# Patient Record
Sex: Male | Born: 1945 | Race: Black or African American | Hispanic: No | Smoking: Former smoker
Health system: Southern US, Community
[De-identification: ages and names within clinical notes are randomized; demographics above are authoritative.]

## PROBLEM LIST (undated history)

## (undated) DIAGNOSIS — C801 Malignant (primary) neoplasm, unspecified: Secondary | ICD-10-CM

## (undated) DIAGNOSIS — A059 Bacterial foodborne intoxication, unspecified: Secondary | ICD-10-CM

## (undated) DIAGNOSIS — Z87442 Personal history of urinary calculi: Secondary | ICD-10-CM

## (undated) HISTORY — PX: APPENDECTOMY: SHX54

## (undated) HISTORY — DX: Bacterial foodborne intoxication, unspecified: A05.9

---

## 2001-10-12 ENCOUNTER — Encounter: Payer: Self-pay | Admitting: Internal Medicine

## 2001-10-12 ENCOUNTER — Ambulatory Visit (HOSPITAL_COMMUNITY): Admission: RE | Admit: 2001-10-12 | Discharge: 2001-10-12 | Payer: Self-pay | Admitting: Internal Medicine

## 2002-07-28 ENCOUNTER — Emergency Department (HOSPITAL_COMMUNITY): Admission: EM | Admit: 2002-07-28 | Discharge: 2002-07-28 | Payer: Self-pay | Admitting: Emergency Medicine

## 2003-01-12 ENCOUNTER — Ambulatory Visit (HOSPITAL_COMMUNITY): Admission: RE | Admit: 2003-01-12 | Discharge: 2003-01-12 | Payer: Self-pay | Admitting: General Surgery

## 2003-04-01 ENCOUNTER — Ambulatory Visit (HOSPITAL_COMMUNITY): Admission: RE | Admit: 2003-04-01 | Discharge: 2003-04-01 | Payer: Self-pay | Admitting: Family Medicine

## 2003-04-01 ENCOUNTER — Encounter: Payer: Self-pay | Admitting: Family Medicine

## 2003-04-09 ENCOUNTER — Emergency Department (HOSPITAL_COMMUNITY): Admission: EM | Admit: 2003-04-09 | Discharge: 2003-04-09 | Payer: Self-pay | Admitting: *Deleted

## 2003-04-09 ENCOUNTER — Encounter: Payer: Self-pay | Admitting: *Deleted

## 2003-05-13 ENCOUNTER — Encounter: Payer: Self-pay | Admitting: Emergency Medicine

## 2003-05-13 ENCOUNTER — Emergency Department (HOSPITAL_COMMUNITY): Admission: EM | Admit: 2003-05-13 | Discharge: 2003-05-13 | Payer: Self-pay | Admitting: Unknown Physician Specialty

## 2003-05-13 ENCOUNTER — Emergency Department (HOSPITAL_COMMUNITY): Admission: EM | Admit: 2003-05-13 | Discharge: 2003-05-13 | Payer: Self-pay | Admitting: Emergency Medicine

## 2004-02-29 ENCOUNTER — Encounter (INDEPENDENT_AMBULATORY_CARE_PROVIDER_SITE_OTHER): Payer: Self-pay | Admitting: Family Medicine

## 2004-02-29 ENCOUNTER — Ambulatory Visit (HOSPITAL_COMMUNITY): Admission: RE | Admit: 2004-02-29 | Discharge: 2004-02-29 | Payer: Self-pay | Admitting: Family Medicine

## 2004-05-21 ENCOUNTER — Encounter (INDEPENDENT_AMBULATORY_CARE_PROVIDER_SITE_OTHER): Payer: Self-pay | Admitting: Family Medicine

## 2004-05-21 LAB — CONVERTED CEMR LAB: PSA: 0.39 ng/mL

## 2005-07-09 ENCOUNTER — Ambulatory Visit (HOSPITAL_COMMUNITY): Admission: RE | Admit: 2005-07-09 | Discharge: 2005-07-09 | Payer: Self-pay | Admitting: Family Medicine

## 2005-08-12 ENCOUNTER — Ambulatory Visit (HOSPITAL_COMMUNITY): Admission: RE | Admit: 2005-08-12 | Discharge: 2005-08-12 | Payer: Self-pay | Admitting: Family Medicine

## 2006-04-02 ENCOUNTER — Ambulatory Visit (HOSPITAL_COMMUNITY): Admission: RE | Admit: 2006-04-02 | Discharge: 2006-04-02 | Payer: Self-pay | Admitting: Family Medicine

## 2006-07-17 ENCOUNTER — Ambulatory Visit: Payer: Self-pay | Admitting: Family Medicine

## 2006-07-18 ENCOUNTER — Encounter (INDEPENDENT_AMBULATORY_CARE_PROVIDER_SITE_OTHER): Payer: Self-pay | Admitting: Family Medicine

## 2006-07-18 LAB — CONVERTED CEMR LAB
RBC count: 5.25 10*6/uL
WBC, blood: 5.9 10*3/uL

## 2006-07-31 ENCOUNTER — Ambulatory Visit: Payer: Self-pay | Admitting: Family Medicine

## 2006-11-24 ENCOUNTER — Ambulatory Visit: Payer: Self-pay | Admitting: Family Medicine

## 2006-11-29 ENCOUNTER — Encounter: Payer: Self-pay | Admitting: Family Medicine

## 2006-11-29 DIAGNOSIS — M545 Low back pain, unspecified: Secondary | ICD-10-CM | POA: Insufficient documentation

## 2006-11-29 DIAGNOSIS — J4489 Other specified chronic obstructive pulmonary disease: Secondary | ICD-10-CM | POA: Insufficient documentation

## 2006-11-29 DIAGNOSIS — E785 Hyperlipidemia, unspecified: Secondary | ICD-10-CM | POA: Insufficient documentation

## 2006-11-29 DIAGNOSIS — M129 Arthropathy, unspecified: Secondary | ICD-10-CM | POA: Insufficient documentation

## 2006-11-29 DIAGNOSIS — J449 Chronic obstructive pulmonary disease, unspecified: Secondary | ICD-10-CM | POA: Insufficient documentation

## 2007-01-05 ENCOUNTER — Ambulatory Visit: Payer: Self-pay | Admitting: Family Medicine

## 2007-01-05 DIAGNOSIS — F528 Other sexual dysfunction not due to a substance or known physiological condition: Secondary | ICD-10-CM | POA: Insufficient documentation

## 2007-01-05 LAB — CONVERTED CEMR LAB: LDL Goal: 160 mg/dL

## 2007-07-14 ENCOUNTER — Telehealth (INDEPENDENT_AMBULATORY_CARE_PROVIDER_SITE_OTHER): Payer: Self-pay | Admitting: Family Medicine

## 2007-07-14 ENCOUNTER — Ambulatory Visit: Payer: Self-pay | Admitting: Family Medicine

## 2007-08-11 ENCOUNTER — Ambulatory Visit: Payer: Self-pay | Admitting: Family Medicine

## 2007-08-11 LAB — CONVERTED CEMR LAB: OCCULT 1: NEGATIVE

## 2007-08-12 ENCOUNTER — Encounter (INDEPENDENT_AMBULATORY_CARE_PROVIDER_SITE_OTHER): Payer: Self-pay | Admitting: Family Medicine

## 2007-08-12 ENCOUNTER — Telehealth (INDEPENDENT_AMBULATORY_CARE_PROVIDER_SITE_OTHER): Payer: Self-pay | Admitting: *Deleted

## 2007-08-12 LAB — CONVERTED CEMR LAB
ALT: <8 units/L (ref 0–53)
BUN: 15 mg/dL (ref 6–23)
Basophils Relative: 1 % (ref 0–1)
Eosinophils Absolute: 0.1 10*3/uL (ref 0.0–0.7)
Eosinophils Relative: 3 % (ref 0–5)
LDL Cholesterol: 133 mg/dL — ABNORMAL HIGH (ref 0–99)
Lymphocytes Relative: 42 % (ref 12–46)
Lymphs Abs: 1.6 10*3/uL (ref 0.7–3.3)
MCHC: 32.9 g/dL (ref 30.0–36.0)
MCV: 90.9 fL (ref 78.0–100.0)
Monocytes Absolute: 0.6 10*3/uL (ref 0.2–0.7)
PSA: 0.34 ng/mL (ref 0.10–4.00)
Platelets: 242 10*3/uL (ref 150–400)
Potassium: 4.2 meq/L (ref 3.5–5.3)
Sodium: 141 meq/L (ref 135–145)
TSH: 1.094 microintl units/mL (ref 0.350–5.50)
Total Bilirubin: 0.5 mg/dL (ref 0.3–1.2)
Total Protein: 6.6 g/dL (ref 6.0–8.3)
VLDL: 25 mg/dL (ref 0–40)
WBC: 3.7 10*3/uL — ABNORMAL LOW (ref 4.0–10.5)

## 2007-09-22 ENCOUNTER — Ambulatory Visit: Payer: Self-pay | Admitting: Family Medicine

## 2007-09-22 LAB — CONVERTED CEMR LAB: LDL Goal: 130 mg/dL

## 2007-12-22 ENCOUNTER — Ambulatory Visit: Payer: Self-pay | Admitting: Family Medicine

## 2007-12-23 ENCOUNTER — Telehealth (INDEPENDENT_AMBULATORY_CARE_PROVIDER_SITE_OTHER): Payer: Self-pay | Admitting: *Deleted

## 2008-02-05 ENCOUNTER — Ambulatory Visit: Payer: Self-pay | Admitting: Family Medicine

## 2008-02-05 ENCOUNTER — Ambulatory Visit (HOSPITAL_COMMUNITY): Admission: RE | Admit: 2008-02-05 | Discharge: 2008-02-05 | Payer: Self-pay | Admitting: Family Medicine

## 2008-02-08 ENCOUNTER — Telehealth (INDEPENDENT_AMBULATORY_CARE_PROVIDER_SITE_OTHER): Payer: Self-pay | Admitting: Family Medicine

## 2008-02-12 ENCOUNTER — Telehealth (INDEPENDENT_AMBULATORY_CARE_PROVIDER_SITE_OTHER): Payer: Self-pay | Admitting: *Deleted

## 2008-02-12 ENCOUNTER — Ambulatory Visit (HOSPITAL_COMMUNITY): Admission: RE | Admit: 2008-02-12 | Discharge: 2008-02-12 | Payer: Self-pay | Admitting: Family Medicine

## 2008-02-12 ENCOUNTER — Ambulatory Visit: Payer: Self-pay | Admitting: Family Medicine

## 2008-02-12 DIAGNOSIS — F411 Generalized anxiety disorder: Secondary | ICD-10-CM | POA: Insufficient documentation

## 2008-02-12 DIAGNOSIS — E46 Unspecified protein-calorie malnutrition: Secondary | ICD-10-CM | POA: Insufficient documentation

## 2008-03-29 ENCOUNTER — Ambulatory Visit: Payer: Self-pay | Admitting: Family Medicine

## 2008-03-31 ENCOUNTER — Telehealth (INDEPENDENT_AMBULATORY_CARE_PROVIDER_SITE_OTHER): Payer: Self-pay | Admitting: *Deleted

## 2008-04-01 ENCOUNTER — Telehealth (INDEPENDENT_AMBULATORY_CARE_PROVIDER_SITE_OTHER): Payer: Self-pay | Admitting: *Deleted

## 2008-04-12 ENCOUNTER — Ambulatory Visit: Payer: Self-pay | Admitting: Family Medicine

## 2008-04-13 ENCOUNTER — Encounter (INDEPENDENT_AMBULATORY_CARE_PROVIDER_SITE_OTHER): Payer: Self-pay | Admitting: Family Medicine

## 2008-04-26 ENCOUNTER — Ambulatory Visit: Payer: Self-pay | Admitting: Family Medicine

## 2008-05-10 ENCOUNTER — Ambulatory Visit: Payer: Self-pay | Admitting: Family Medicine

## 2008-09-09 ENCOUNTER — Encounter (INDEPENDENT_AMBULATORY_CARE_PROVIDER_SITE_OTHER): Payer: Self-pay | Admitting: Family Medicine

## 2008-09-09 LAB — CONVERTED CEMR LAB
ALT: 9 units/L (ref 0–53)
AST: 18 units/L (ref 0–37)
Albumin: 4.1 g/dL (ref 3.5–5.2)
BUN: 14 mg/dL (ref 6–23)
CO2: 22 meq/L (ref 19–32)
Chloride: 109 meq/L (ref 96–112)
Cholesterol: 190 mg/dL (ref 0–200)
Creatinine, Ser: 1.08 mg/dL (ref 0.40–1.50)
Potassium: 4.6 meq/L (ref 3.5–5.3)
Sodium: 142 meq/L (ref 135–145)
Total Bilirubin: 0.6 mg/dL (ref 0.3–1.2)

## 2008-09-12 ENCOUNTER — Ambulatory Visit: Payer: Self-pay | Admitting: Family Medicine

## 2008-09-20 ENCOUNTER — Ambulatory Visit: Payer: Self-pay | Admitting: Family Medicine

## 2008-09-20 LAB — CONVERTED CEMR LAB: Inflenza A Ag: NEGATIVE

## 2008-10-17 ENCOUNTER — Ambulatory Visit: Payer: Self-pay | Admitting: Family Medicine

## 2008-10-18 LAB — CONVERTED CEMR LAB
Amylase: 71 units/L (ref 0–105)
BUN: 14 mg/dL (ref 6–23)
Basophils Absolute: 0 10*3/uL (ref 0.0–0.1)
Chloride: 106 meq/L (ref 96–112)
Creatinine, Ser: 0.86 mg/dL (ref 0.40–1.50)
Eosinophils Relative: 4 % (ref 0–5)
Lipase: 28 units/L (ref 0–75)
Lymphs Abs: 1.6 10*3/uL (ref 0.7–4.0)
MCV: 89.9 fL (ref 78.0–100.0)
Monocytes Relative: 12 % (ref 3–12)
Neutrophils Relative %: 52 % (ref 43–77)
Platelets: 284 10*3/uL (ref 150–400)
Potassium: 4.5 meq/L (ref 3.5–5.3)
RDW: 14 % (ref 11.5–15.5)
Sodium: 141 meq/L (ref 135–145)
Total Bilirubin: 0.5 mg/dL (ref 0.3–1.2)
Total Protein: 6.8 g/dL (ref 6.0–8.3)
WBC: 5.1 10*3/uL (ref 4.0–10.5)

## 2008-10-24 ENCOUNTER — Ambulatory Visit: Payer: Self-pay | Admitting: Family Medicine

## 2008-10-24 LAB — CONVERTED CEMR LAB
Bilirubin Urine: NEGATIVE
Blood in Urine, dipstick: NEGATIVE
Glucose, Urine, Semiquant: NEGATIVE
Protein, U semiquant: NEGATIVE
pH: 7

## 2008-10-25 ENCOUNTER — Encounter (INDEPENDENT_AMBULATORY_CARE_PROVIDER_SITE_OTHER): Payer: Self-pay | Admitting: Family Medicine

## 2008-10-25 ENCOUNTER — Ambulatory Visit (HOSPITAL_COMMUNITY): Admission: RE | Admit: 2008-10-25 | Discharge: 2008-10-25 | Payer: Self-pay | Admitting: Family Medicine

## 2008-10-26 ENCOUNTER — Encounter (INDEPENDENT_AMBULATORY_CARE_PROVIDER_SITE_OTHER): Payer: Self-pay | Admitting: Family Medicine

## 2008-10-26 ENCOUNTER — Ambulatory Visit (HOSPITAL_COMMUNITY): Admission: RE | Admit: 2008-10-26 | Discharge: 2008-10-26 | Payer: Self-pay | Admitting: Family Medicine

## 2008-10-29 ENCOUNTER — Encounter (INDEPENDENT_AMBULATORY_CARE_PROVIDER_SITE_OTHER): Payer: Self-pay | Admitting: Family Medicine

## 2008-10-31 ENCOUNTER — Ambulatory Visit: Payer: Self-pay | Admitting: Internal Medicine

## 2008-10-31 ENCOUNTER — Ambulatory Visit: Payer: Self-pay | Admitting: Family Medicine

## 2008-11-07 ENCOUNTER — Encounter (HOSPITAL_COMMUNITY): Admission: RE | Admit: 2008-11-07 | Discharge: 2008-11-23 | Payer: Self-pay | Admitting: Oncology

## 2008-11-15 ENCOUNTER — Encounter (INDEPENDENT_AMBULATORY_CARE_PROVIDER_SITE_OTHER): Payer: Self-pay | Admitting: Family Medicine

## 2008-11-25 DIAGNOSIS — C801 Malignant (primary) neoplasm, unspecified: Secondary | ICD-10-CM

## 2008-11-25 HISTORY — PX: SMALL INTESTINE SURGERY: SHX150

## 2008-11-25 HISTORY — DX: Malignant (primary) neoplasm, unspecified: C80.1

## 2008-11-28 ENCOUNTER — Encounter (INDEPENDENT_AMBULATORY_CARE_PROVIDER_SITE_OTHER): Payer: Self-pay | Admitting: Family Medicine

## 2008-12-12 ENCOUNTER — Ambulatory Visit: Payer: Self-pay | Admitting: Family Medicine

## 2008-12-12 DIAGNOSIS — M25519 Pain in unspecified shoulder: Secondary | ICD-10-CM | POA: Insufficient documentation

## 2008-12-13 ENCOUNTER — Encounter (INDEPENDENT_AMBULATORY_CARE_PROVIDER_SITE_OTHER): Payer: Self-pay | Admitting: Family Medicine

## 2008-12-23 ENCOUNTER — Encounter (INDEPENDENT_AMBULATORY_CARE_PROVIDER_SITE_OTHER): Payer: Self-pay | Admitting: Family Medicine

## 2008-12-30 ENCOUNTER — Encounter (INDEPENDENT_AMBULATORY_CARE_PROVIDER_SITE_OTHER): Payer: Self-pay | Admitting: Family Medicine

## 2009-01-16 ENCOUNTER — Ambulatory Visit: Payer: Self-pay | Admitting: Family Medicine

## 2009-01-16 DIAGNOSIS — R197 Diarrhea, unspecified: Secondary | ICD-10-CM | POA: Insufficient documentation

## 2009-01-16 DIAGNOSIS — J61 Pneumoconiosis due to asbestos and other mineral fibers: Secondary | ICD-10-CM | POA: Insufficient documentation

## 2009-01-16 DIAGNOSIS — R634 Abnormal weight loss: Secondary | ICD-10-CM | POA: Insufficient documentation

## 2009-02-10 ENCOUNTER — Encounter (INDEPENDENT_AMBULATORY_CARE_PROVIDER_SITE_OTHER): Payer: Self-pay | Admitting: Family Medicine

## 2009-02-27 ENCOUNTER — Ambulatory Visit: Payer: Self-pay | Admitting: Family Medicine

## 2009-02-27 ENCOUNTER — Ambulatory Visit (HOSPITAL_COMMUNITY): Admission: RE | Admit: 2009-02-27 | Discharge: 2009-02-27 | Payer: Self-pay | Admitting: Family Medicine

## 2009-02-27 DIAGNOSIS — M25559 Pain in unspecified hip: Secondary | ICD-10-CM | POA: Insufficient documentation

## 2009-02-28 ENCOUNTER — Encounter (INDEPENDENT_AMBULATORY_CARE_PROVIDER_SITE_OTHER): Payer: Self-pay | Admitting: Family Medicine

## 2009-04-10 ENCOUNTER — Ambulatory Visit: Payer: Self-pay | Admitting: Family Medicine

## 2009-04-11 ENCOUNTER — Encounter (INDEPENDENT_AMBULATORY_CARE_PROVIDER_SITE_OTHER): Payer: Self-pay | Admitting: Family Medicine

## 2009-04-12 ENCOUNTER — Encounter (INDEPENDENT_AMBULATORY_CARE_PROVIDER_SITE_OTHER): Payer: Self-pay | Admitting: Family Medicine

## 2009-05-24 ENCOUNTER — Ambulatory Visit: Payer: Self-pay | Admitting: Family Medicine

## 2009-05-24 DIAGNOSIS — Z91013 Allergy to seafood: Secondary | ICD-10-CM | POA: Insufficient documentation

## 2009-08-08 ENCOUNTER — Ambulatory Visit: Payer: Self-pay | Admitting: Family Medicine

## 2009-11-18 IMAGING — CR DG HIP COMPLETE 2+V*R*
3 series · 3 of 3 positions shown · non-contrast
Comparison: None

CLINICAL DATA: Right hip pain.  No injury.

RIGHT HIP - COMPLETE 2+ VIEW

[view not recorded (1 of 3)]
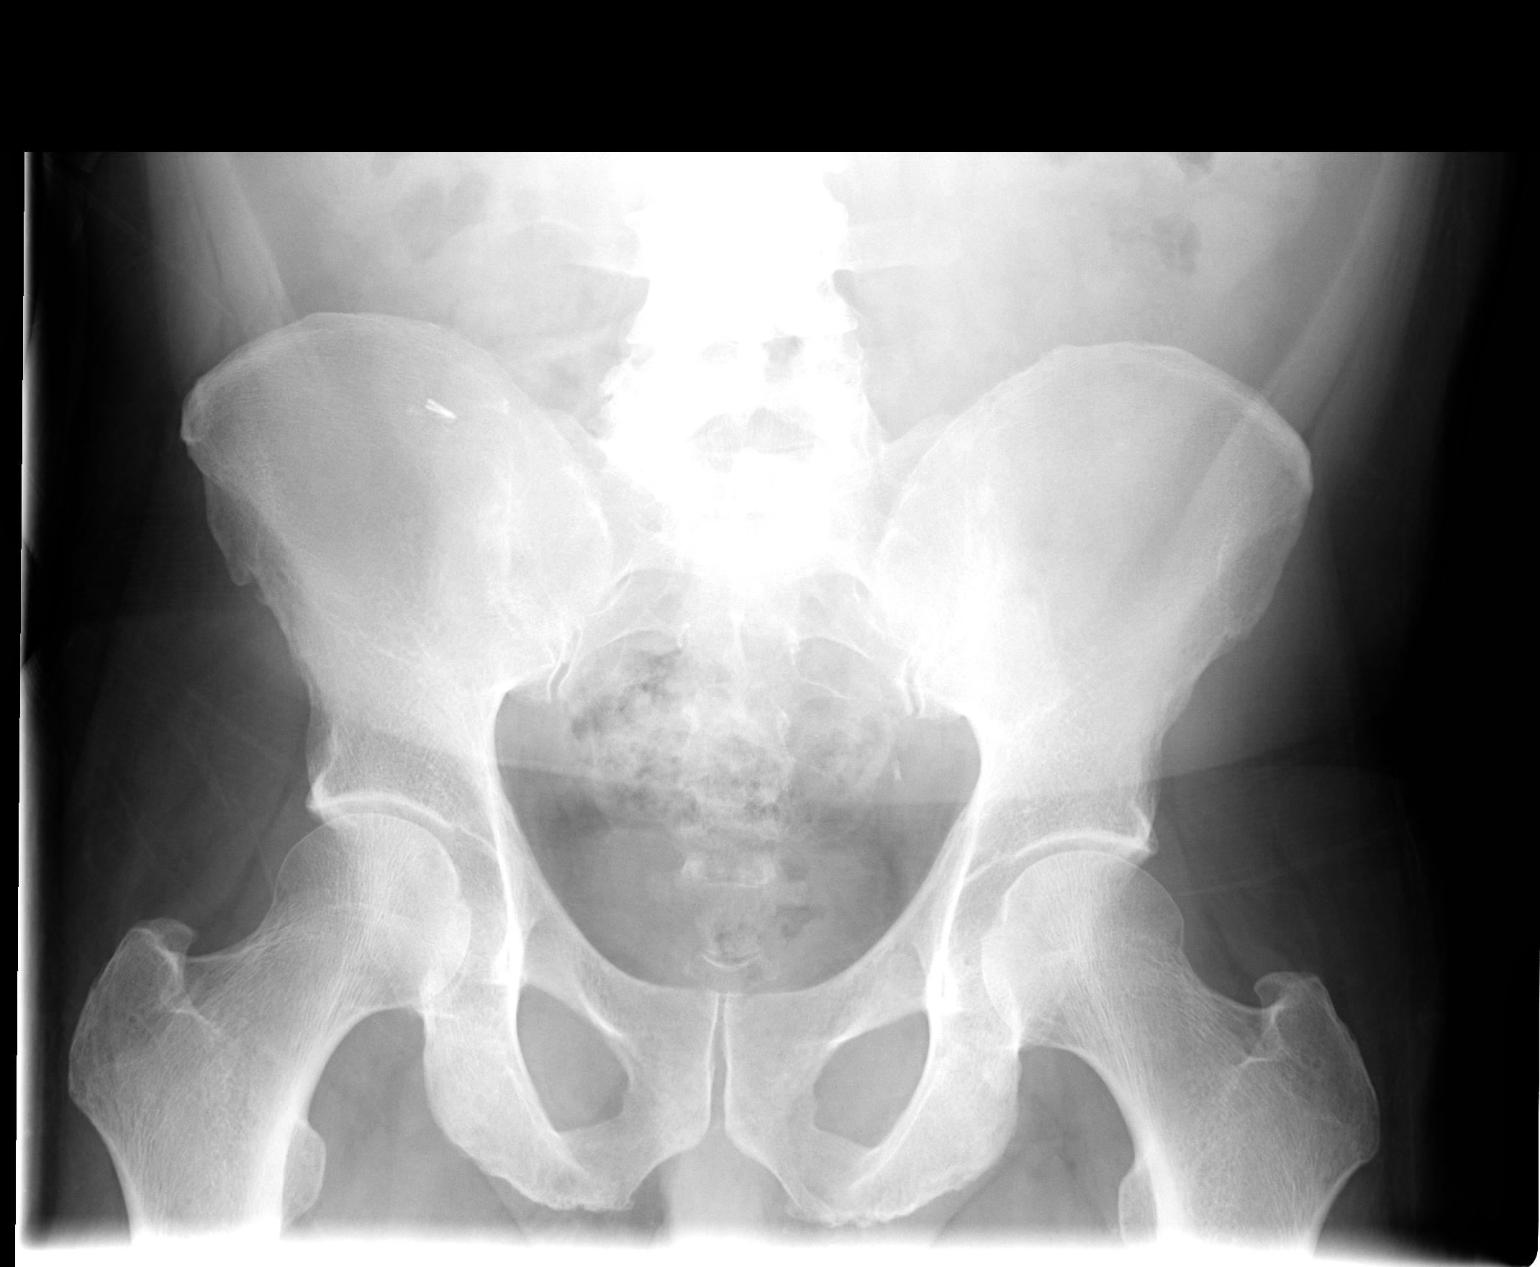

[view not recorded (2 of 3)]
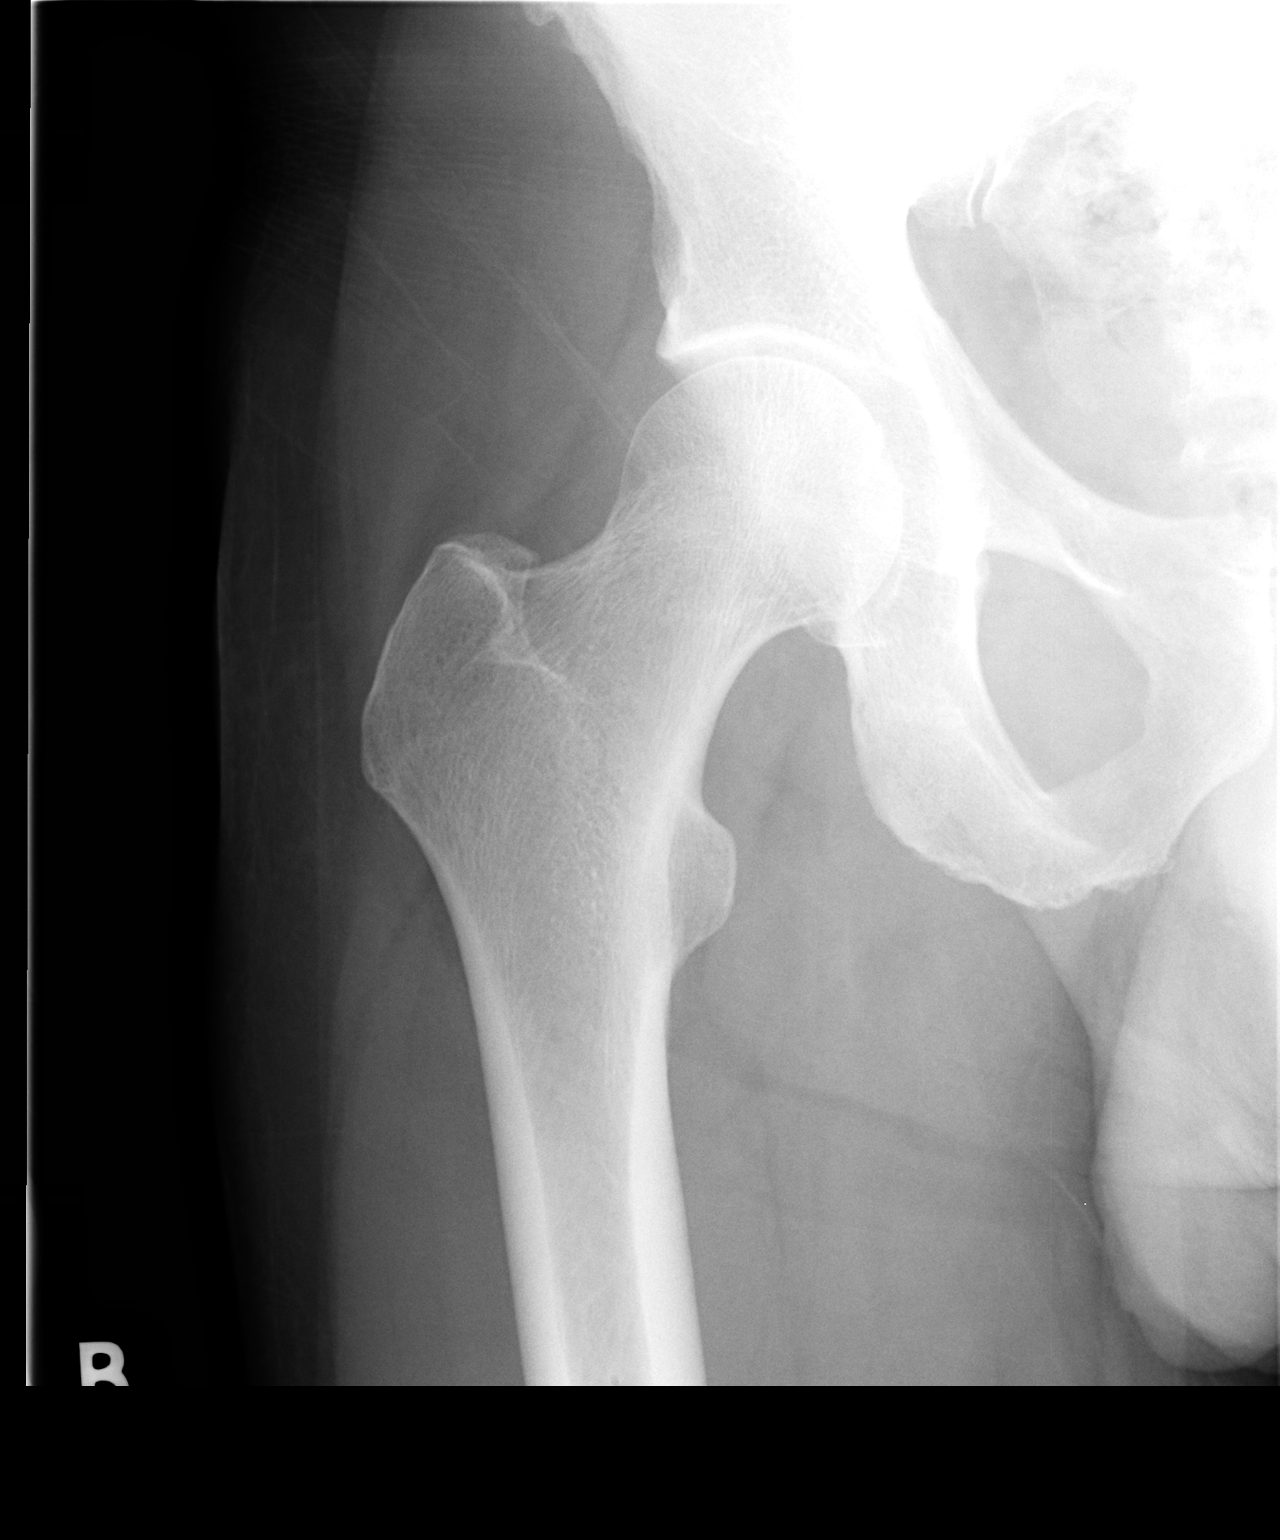

[view not recorded (3 of 3)]
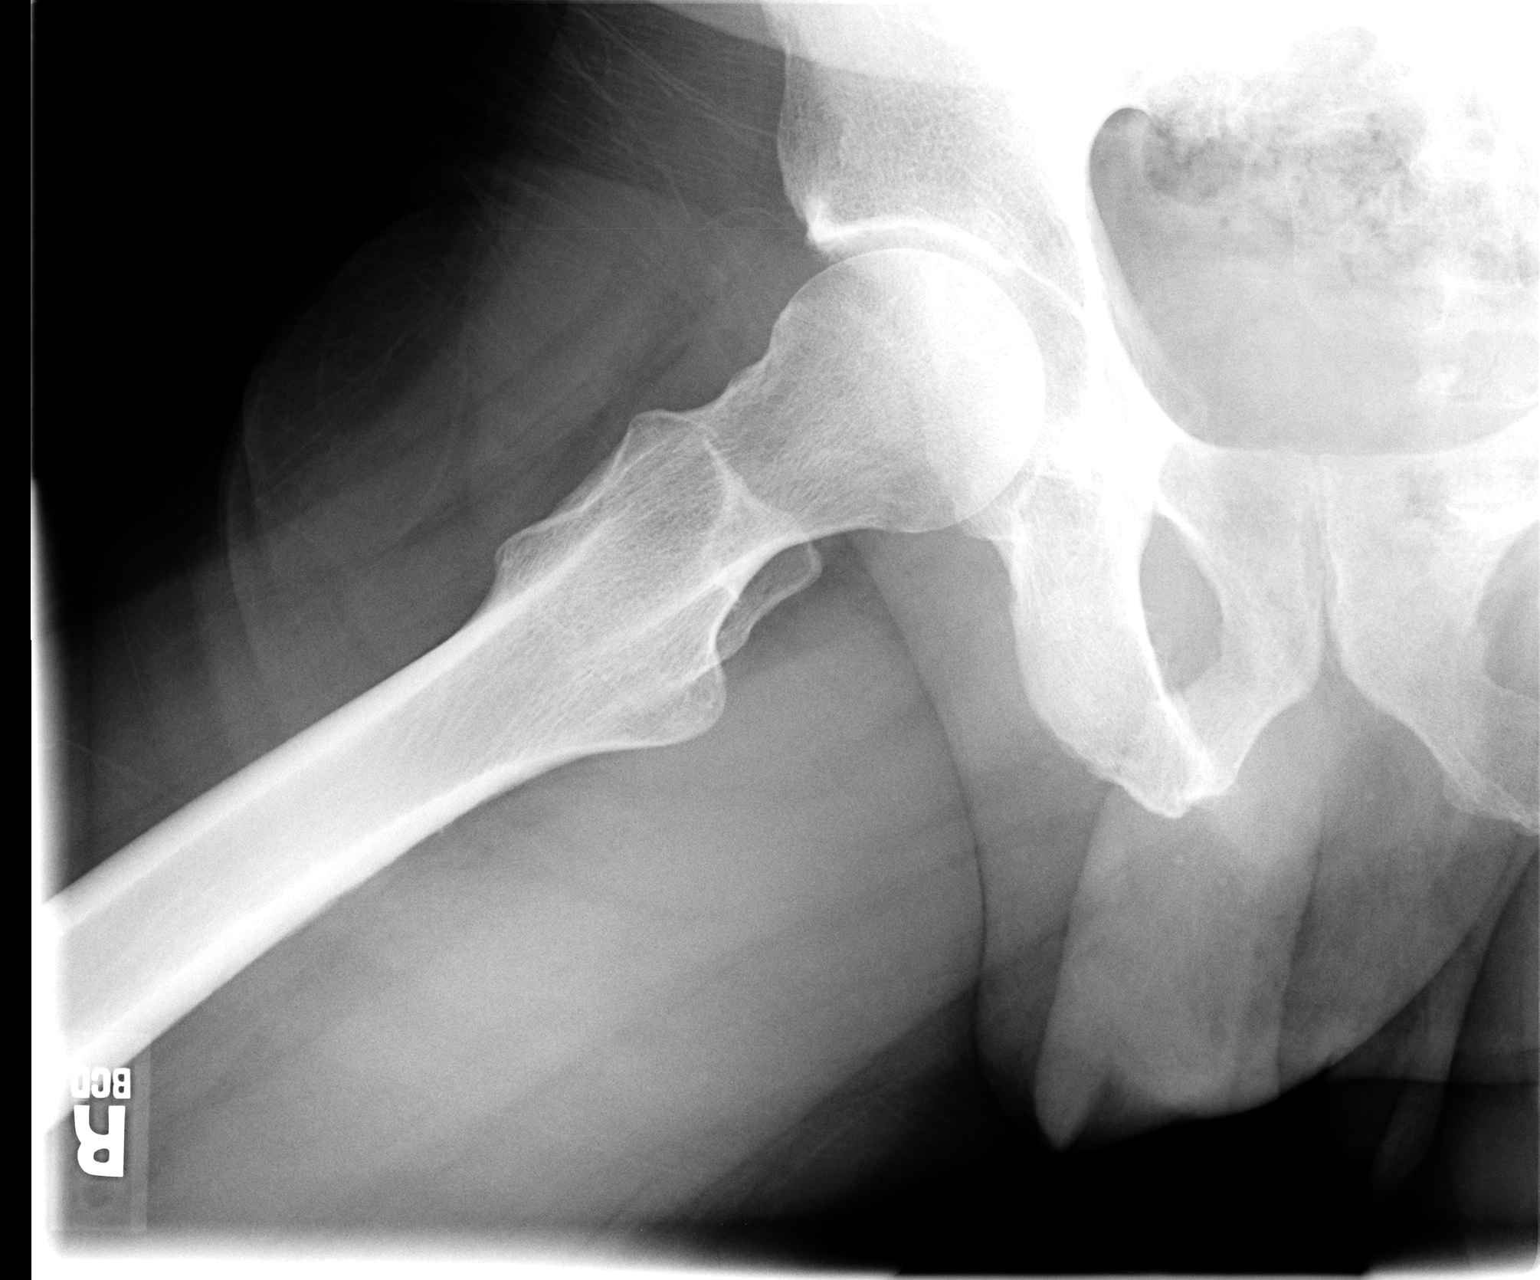

[3 of 3 positions shown; findings below may reference images not displayed]

FINDINGS: Negative for fracture.  There is no significant joint
space narrowing or spurring.  Femoral head contour is normal.  No
significant bony abnormality.
IMPRESSION: Negative

## 2010-12-16 ENCOUNTER — Encounter: Payer: Self-pay | Admitting: Family Medicine

## 2010-12-23 LAB — CONVERTED CEMR LAB
ALT: 11 units/L (ref 0–53)
AST: 21 units/L (ref 0–37)
Alkaline Phosphatase: 55 units/L (ref 39–117)
Alkaline Phosphatase: 61 units/L (ref 39–117)
BUN: 15 mg/dL (ref 6–23)
Band Neutrophils: 3 % (ref 0–10)
Bilirubin Urine: NEGATIVE
Blood in Urine, dipstick: NEGATIVE
CO2: 23 meq/L (ref 19–32)
Chloride: 108 meq/L (ref 96–112)
Cholesterol: 195 mg/dL (ref 0–200)
Glucose, Bld: 102 mg/dL — ABNORMAL HIGH (ref 70–99)
Glucose, Bld: 90 mg/dL (ref 70–99)
HCT: 42.9 % (ref 39.0–52.0)
Ketones, urine, test strip: NEGATIVE
LDL Cholesterol: 108 mg/dL — ABNORMAL HIGH (ref 0–99)
MCV: 86.8 fL (ref 78.0–100.0)
Neutrophils Relative %: 24 % — ABNORMAL LOW (ref 43–77)
Platelets: 216 10*3/uL (ref 150–400)
Potassium: 4.4 meq/L (ref 3.5–5.3)
Protein, U semiquant: NEGATIVE
RDW: 13.8 % (ref 11.5–15.5)
Sodium: 136 meq/L (ref 135–145)
Total Bilirubin: 0.5 mg/dL (ref 0.3–1.2)
Total Bilirubin: 0.5 mg/dL (ref 0.3–1.2)
Total CHOL/HDL Ratio: 6.1
Total Protein: 7 g/dL (ref 6.0–8.3)
WBC Urine, dipstick: NEGATIVE
WBC: 3.1 10*3/uL — ABNORMAL LOW (ref 4.0–10.5)

## 2010-12-25 NOTE — Assessment & Plan Note (Signed)
Summary: FOLLOW UP 6 WEEK/SLJ   Vital Signs:  Patient profile:   65 year old male Height:      70 inches Weight:      167 pounds BMI:     24.05 O2 Sat:      98 % Temp:     97.8 degrees F Pulse rate:   70 / minute Resp:     14 per minute BP sitting:   129 / 74  Vitals Entered By: Sherilyn Banker LPN (Apr 10, 2009 8:17 AM) CC: follow-up visit   Primary Provider:  Franchot Heidelberg, MD  CC:  follow-up visit.  History of Present Illness: Pt in for recheck.  He notes he is doing well. He has a hx of carcinoid and had this resceted earlier this year at [facility]. He state she has a good apetite. He denies nausea and vomitting. He cont with lose stool and states varies by day - firmer some, worse others. He cont on Imodium AD and ypghurt and it has helped but still lose. He denies bloody stool and has not had abd pain.  He saw Dr. Marilynn Rail for recheck at [facility] dwas told doing well. Saw Oncology as well and had labs as well as CT and was told to follow-up in 3 months. Told may not need chemo or radiation. He has lost 5 pounds since last visit and thinks lose stools are playing role. He states would love not to have s many lose BMS. States everytime he eats goes 2 to 3 times.  He was recetnly also seen with hipand shoulder pain. He had xrays and the hip was normal with the shoulder showing the following -   Normal alignment and no fracture.  There is moderate degenerative change and spurring in the [facility] joint. There is down sloping of the acromion which contains a lateral osteophyte.  He is cont to have pain in the shoulder more so than the hip. He does not want steroid shot at this time and fears  the needle. He states he hs some LOrtab left and it helps.  He now presents.  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 Two Puffs Every 6 Hours 2)  Aspirin Adult Low Strength 81 Mg  Tbec (Aspirin) .... One Daily 3)  Imodium A-D 2 Mg Tabs (Loperamide Hcl) .... Up To 6 Daily Per Dr. Marilynn Rail 4)  Lortab 7.5 7.5-500 Mg Tabs (Hydrocodone-Acetaminophen) .... One Every 6 Hours As Needed 5)  Nabumetone 500 Mg Tabs (Nabumetone) .... One  Two Times A Day 6)  Colestid 1 Gm Tabs (Colestipol Hcl) .... One Two Times A Day For 5 Days, Then Increase To Two Two Times A Day As Needed  Allergies (verified): No Known Drug Allergies  Past History:  Past Medical History:    Current Problems:     LUQ PAIN (ICD-789.02)    BRONCHITIS, ACUTE (ICD-466.0)    DIZZINESS (ICD-780.4)    SKIN RASH (ICD-782.1)    ERECTILE DYSFUNCTION (ICD-302.72)    COPD (ICD-496)    ARTHRITIS (ICD-716.90)    BRONCHITIS NOS (ICD-490)    LOW BACK PAIN (ICD-724.2)    HYPERLIPIDEMIA (ICD-272.4)     (02/12/2008)  Past Surgical History:    1. Appendectomy    2. February 2010 - carcinoid tumor resected with distal jejenal and proximal ileal resection and primary re-anastomosis under Dr. Marilynn Rail at [facility]. (01/16/2009)  Family History:    Dad - Dead 1 "Old Age"    Mom: Dead 66 - Leukemia    2 sisters 66 and 31 healthy    2 brothers deceased at age 13 CVAs and 65 Lung Cancer   (09/12/2008)  Social History:     Married    Former smoker    No ETOH abuse    Occupation: Sports administrator in [place]    Lives with wife    Finished 11 th grade     (02/27/2009)  Risk Factors:    Alcohol Use: 0 (02/27/2009)    >5 drinks/d w/in last 3 months: N/A    Caffeine Use: N/A    Diet: N/A    Exercise: no (01/05/2007)  Risk Factors:    Smoking Status: quit (02/27/2009)    Packs/Day: 3.0 (02/27/2009)    Cigars/wk: N/A    Pipe Use/wk: N/A    Cans of tobacco/wk: N/A    Passive Smoke Exposure: yes (01/05/2007)  Review of Systems General:  Denies chills, fever, and sweats. Resp:  Denies cough, shortness of breath, sputum productive, and wheezing. GI:  See HPI. GU:  Denies nocturia, urinary frequency, and urinary hesitancy. Psych:  Denies anxiety and depression.  Physical Exam  General:  Well-developed,well-nourished,in no acute distress; alert,appropriate and cooperative throughout examination Lungs:  Fair airation with no wheezing or rales. Heart:  Normal rate and regular rhythm. S1 and S2 normal without gallop, murmur, click, rub or other extra sounds. Abdomen:  Soft, NT,no rebounds, BS + Extremities:  Right shouldr and hip pain in all ROM. Good pulses. No weakness. States has pain to move. Admits here has tried Vicodin and did not help either. Cervical Nodes:  No lymphadenopathy noted Psych:  Cognition and judgment appear intact. Alert and cooperative with normal attention span and concentration. No apparent delusions, illusions, hallucinations   Impression & Recommendations:  Problem # 1:  SHOULDER PAIN, RIGHT (ICD-719.41) Discussed. DJD. Start NSAID. LOrtab as needed for breakthrough. Councelled method of use, advised risk and benefit. Recheck 6 weeks. Consider steroid injection.  His updated medication list for this problem includes:    Aspirin Adult Low Strength 81 Mg Tbec (Aspirin) ..... One daily    Lortab 7.5 7.5-500 Mg Tabs (Hydrocodone-acetaminophen) ..... One every 6 hours as needed     Nabumetone 500 Mg Tabs (Nabumetone) ..... One two times a day  Problem # 2:  HIP PAIN, RIGHT (ICD-719.45) Discussed. Councelled ROM exersizes. Advised need for NSAID. Recheck 6 weeks. His updated medication list for this problem includes:    Aspirin Adult Low Strength 81 Mg Tbec (Aspirin) ..... One daily  Lortab 7.5 7.5-500 Mg Tabs (Hydrocodone-acetaminophen) ..... One every 6 hours as needed    Nabumetone 500 Mg Tabs (Nabumetone) ..... One two times a day  Problem # 3:  WEIGHT LOSS, RECENT (ICD-783.21) Discussed. Suspect lose stool and malabsorption may be cause for this. Would advise trial Colestid to see if we can slow this. Cont Imodium AD and taper as coilestid takes effect. Councelled high protein diet.   Problem # 4:  CARCINOID TUMOR - MALIGNANT AND  MID GUT (ICD-239.9) Stable. See [facility] - Dr. Marilynn Rail as set, as well as Oncology for follow-up in 3 months. Agrees.  Problem # 5:  Preventive Health Care (ICD-V70.0) Rectal and PSA in 6 weeks  as overdue. Agreeable.  Complete Medication List: 1)  Albuterol Sulfate 4 Mg Tb12 (Albuterol sulfate) .... Two puffs every 6 hours 2)  Aspirin Adult Low Strength 81 Mg Tbec (Aspirin) .... One daily 3)  Imodium A-d 2 Mg Tabs (Loperamide hcl) .... Up to 6 daily per dr. Marilynn Rail 4)  Lortab 7.5 7.5-500 Mg Tabs (Hydrocodone-acetaminophen) .... One every 6 hours as needed 5)  Nabumetone 500 Mg Tabs (Nabumetone) .... One two times a day 6)  Colestid 1 Gm Tabs (Colestipol hcl) .... One two times a day for 5 days, then increase to two two times a day as needed  Patient Instructions: 1)  Please schedule a follow-up appointment in 6 weeks. Prescriptions: COLESTID 1 GM TABS (COLESTIPOL HCL) One two times a day for 5 days, then increase to two two times a day as needed  #24month x 3   Entered and Authorized by:   Franchot Heidelberg MD   Signed by:   Franchot Heidelberg MD on 04/10/2009   Method used:   Print then Give to Patient   RxID:    [id number] NABUMETONE 500 MG TABS (NABUMETONE) One two times a day  #60 x 0   Entered and Authorized by:   Franchot Heidelberg MD   Signed by:   Franchot Heidelberg MD on 04/10/2009   Method used:   Print then Give to Patient   RxID:   [id number] LORTAB 7.5 7.5-500 MG TABS (HYDROCODONE-ACETAMINOPHEN) One every 6 hours as needed  #120 x 0   Entered and Authorized by:   Franchot Heidelberg MD   Signed by:   Franchot Heidelberg MD on 04/10/2009   Method used:   Print then Give to Patient   RxID:   [id number]    Preventive Care Screening  PSA:    Date:  04/10/2009    Results:  0.34 in Sept 2008  -advised repeat and agreeable next visit

## 2011-03-21 ENCOUNTER — Other Ambulatory Visit (HOSPITAL_COMMUNITY): Payer: Self-pay | Admitting: Family Medicine

## 2011-03-25 ENCOUNTER — Ambulatory Visit (HOSPITAL_COMMUNITY)
Admission: RE | Admit: 2011-03-25 | Discharge: 2011-03-25 | Disposition: A | Discharge status: Home / Self Care | Payer: Medicare Other | Source: Ambulatory Visit | Attending: Family Medicine | Admitting: Family Medicine

## 2011-03-25 ENCOUNTER — Other Ambulatory Visit (HOSPITAL_COMMUNITY): Payer: Self-pay | Admitting: Family Medicine

## 2011-03-25 DIAGNOSIS — K224 Dyskinesia of esophagus: Secondary | ICD-10-CM | POA: Insufficient documentation

## 2011-03-25 DIAGNOSIS — R1310 Dysphagia, unspecified: Secondary | ICD-10-CM | POA: Insufficient documentation
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
## 2011-04-12 NOTE — Consult Note (Signed)
NAME:  Peter [name], GLASSCOCK NO.:  [id number]   MEDICAL RECORD NO.:  [id number]                   PATIENT TYPE:  EMS   LOCATION:                                       FACILITY:  [facility]   PHYSICIAN:  Katy Fitch. Naaman Plummer., M.D.          DATE OF BIRTH:  1946/08/15   DATE OF CONSULTATION:  DATE OF DISCHARGE:                                   CONSULTATION   NOTATION:  Please note that this is a redictation on May 31, 2003, as the  original appears to have been lost in the system.   REASON FOR CONSULTATION:  Router injury, left long fingertip.   HISTORY:  The patient is a 65 year old, right hand-dominant man, who  sustained a significant injury to his long finger while using a router at  home.  He is retired.   He was triaged from the [facility] Emergency Room to the [facility].  [facility] Emergency Room for a hand surgery consultation.  He  was seen in consultation in the minor operating facility of the .  [facility] Emergency Room.  He had no other significant injuries  other than a significant mangling injury to the tip of his left long finger.  X-rays of the finger demonstrated a loss of the tuft due to an open fracture  of the distal phalanx.  He had lost a portion of the pulp, and a small  portion of the distal ventral nail fold and nail plate.   DESCRIPTION OF PROCEDURE:  After an informed consent in the emergency room,  we recommended to proceed directly to a minor repair of his injury.  After  the placement of a 2% lidocaine digital block at the metacarpal head level,  the left arm was scrubbed with Betadine soap and painted with Betadine  solution, followed by the application of sterile towels.   His wound was sharply debrided of devitalized skin.  This would be an  excisional debridement.  We subsequently irrigated his open fracture and  repaired the pulp laceration with #5-0 nylon interrupted suture.  His  minor  nail matrix and plate injury will be allowed to heal by secondary intention.   DISPOSITION:  We advised the patient to keep his wound clean.  He was given  a prescription for Vicodin 5 mg, one or two p.o. q.4-6h. p.r.n. pain, #24  tablets without refill.  He is advised to return to our office for a follow-  up evaluation in five days.  He is instructed to keep his wound dry and  clean for a minimum of five days.  There were no apparent complications.  Katy Fitch Naaman Plummer., M.D.    RVS/MEDQ  D:  05/31/2003  T:  05/31/2003  Job:  [id number]

## 2011-04-12 NOTE — H&P (Signed)
   NAME:  ARDELL, Peter [name]                       ACCOUNT NO.:  [id number]   MEDICAL RECORD NO.:  [id number]                   PATIENT TYPE:  AMB   LOCATION:  DAY                                  FACILITY:  [facility]   PHYSICIAN:  Jerolyn Shin C. Katrinka Blazing, M.D.                DATE OF BIRTH:  Dec 06, 1945   DATE OF ADMISSION:  DATE OF DISCHARGE:                                HISTORY & PHYSICAL   HISTORY OF PRESENT ILLNESS:  A 65 year old male with a family history of  colon cancer. He has been asymptomatic. He has had normal bowel movements.  He denies abdominal pain. Stools have been guaiac negative. The patient is  presenting for screening colonoscopy.   PAST MEDICAL HISTORY:  He has no major medical illnesses. He takes no  chronic medications.   REVIEW OF SYMPTOMS:  Negative.   PAST SURGICAL HISTORY:  He has not had any major surgery.   PHYSICAL EXAMINATION:  VITAL SIGNS:  Blood pressure 120/64, pulse 76,  respirations 20, weight 190 pounds.  HEENT:  Normal.  NECK:  Supple without JVD or bruit.  CHEST:  Clear to auscultation.  HEART:  Regular rate and rhythm without murmur, gallop or rub.  ABDOMEN:  Soft, nontender, no masses.  EXTREMITIES:  Show no cyanosis, clubbing or edema.  NEUROLOGIC:  No focal motor, sensory or cerebellar deficit.   IMPRESSION:  Positive family history of colon cancer with need for screening  colonoscopy.   PLAN:  Screening colonoscopy.                                               Dirk Dress. Katrinka Blazing, M.D.    LCS/MEDQ  D:  01/12/2003  T:  01/12/2003  Job:  [id number]

## 2011-04-12 NOTE — Consult Note (Signed)
   NAME:  Peter [name], Peter [name]                       ACCOUNT NO.:  [id number]   MEDICAL RECORD NO.:  [id number]                   PATIENT TYPE:  EMS   LOCATION:  [facility]                                 FACILITY:  [facility]   PHYSICIAN:  Katy Fitch. Naaman Plummer., M.D.          DATE OF BIRTH:  04-01-1946   DATE OF CONSULTATION:  05/13/2003  DATE OF DISCHARGE:  05/13/2003                                   CONSULTATION   CHIEF COMPLAINT:  Router injury, left long finger.   HISTORY:  Peter [name] is a 65 year old right hand dominant retired  man who was using a router at home earlier today who sustained a complex  injury to the tip of his left long finger. He was noted to have injury to  his nail plate, nail fold, and pulp with an apparent open fracture of the  distal phalanx. He was seen at the [facility] emergency room and  triaged to the [facility] emergency room for available hand surgery consult.   He was transported to [facility] by the [place] system.   Upon examination in the emergency room, he was noted to be awake and alert  with his injuries confined to his left hand, primarily the long finger. His  vital signs revealed temperature 96.8 oral, blood pressure 122/76, his heart  rate was 78, and respirations 18. Clinical examination revealed an alert 55-  year-old male. His HEENT was normal. Chest, heart, abdomen unremarkable.  Neurological exam unremarkable. Examination of his hand revealed an untidy  wound to the left long finger tip with avulsion of a portion of the nail,  lost of the distal ventral nail matrix, and a pulp laceration with exposure  of the distal phalanx. He was missing a portion of the tuft of his distal  phalanx.   X-rays of his finger were reviewed revealing an open fracture of the distal  phalanx.   ASSESSMENT:  Router injury, left long finger.   After informed consent, we recommended proceeding to irrigation and  debridement of his wound followed by  repair of his soft tissue wounds.   His nail will be allowed to heal by secondary intention.   Given the open nature of his wound, prophylactic antibiotics in my judgment  will not be necessary.   After informed consent, he was transferred to a gurney for a minor procedure  which will be dictated separately.                                               Katy Fitch Naaman Plummer., M.D.    RVS/MEDQ  D:  05/31/2003  T:  05/31/2003  Job:  [id number]

## 2014-03-30 ENCOUNTER — Encounter (HOSPITAL_COMMUNITY): Payer: Self-pay | Admitting: Emergency Medicine

## 2014-03-30 ENCOUNTER — Emergency Department (HOSPITAL_COMMUNITY)
Admission: EM | Admit: 2014-03-30 | Discharge: 2014-03-31 | Disposition: A | Discharge status: Home / Self Care | Payer: Medicare HMO | Attending: Emergency Medicine | Admitting: Emergency Medicine

## 2014-03-30 ENCOUNTER — Emergency Department (HOSPITAL_COMMUNITY): Payer: Medicare HMO

## 2014-03-30 DIAGNOSIS — Z8509 Personal history of malignant neoplasm of other digestive organs: Secondary | ICD-10-CM | POA: Insufficient documentation

## 2014-03-30 DIAGNOSIS — L03116 Cellulitis of left lower limb: Secondary | ICD-10-CM

## 2014-03-30 DIAGNOSIS — Z79899 Other long term (current) drug therapy: Secondary | ICD-10-CM | POA: Insufficient documentation

## 2014-03-30 DIAGNOSIS — L03119 Cellulitis of unspecified part of limb: Principal | ICD-10-CM

## 2014-03-30 DIAGNOSIS — Q676 Pectus excavatum: Secondary | ICD-10-CM | POA: Insufficient documentation

## 2014-03-30 DIAGNOSIS — L02419 Cutaneous abscess of limb, unspecified: Secondary | ICD-10-CM | POA: Insufficient documentation

## 2014-03-30 HISTORY — DX: Malignant (primary) neoplasm, unspecified: C80.1

## 2014-03-30 LAB — BASIC METABOLIC PANEL
BUN: 14 mg/dL (ref 6–23)
CHLORIDE: 103 meq/L (ref 96–112)
CO2: 24 mEq/L (ref 19–32)
CREATININE: 1.13 mg/dL (ref 0.50–1.35)
Calcium: 8.7 mg/dL (ref 8.4–10.5)
GFR calc Af Amer: 75 mL/min — ABNORMAL LOW (ref >90)
GFR, EST NON AFRICAN AMERICAN: 65 mL/min — AB (ref >90)
Glucose, Bld: 114 mg/dL — ABNORMAL HIGH (ref 70–99)
Potassium: 4 mEq/L (ref 3.7–5.3)
Sodium: 139 mEq/L (ref 137–147)

## 2014-03-30 LAB — CBC WITH DIFFERENTIAL/PLATELET
BASOS ABS: 0 10*3/uL (ref 0.0–0.1)
Basophils Relative: 0 % (ref 0–1)
Eosinophils Absolute: 0.1 10*3/uL (ref 0.0–0.7)
Eosinophils Relative: 1 % (ref 0–5)
HEMATOCRIT: 40.8 % (ref 39.0–52.0)
HEMOGLOBIN: 13.7 g/dL (ref 13.0–17.0)
LYMPHS PCT: 17 % (ref 12–46)
Lymphs Abs: 1.6 10*3/uL (ref 0.7–4.0)
MCH: 30.6 pg (ref 26.0–34.0)
MCHC: 33.6 g/dL (ref 30.0–36.0)
MCV: 91.1 fL (ref 78.0–100.0)
MONOS PCT: 9 % (ref 3–12)
Monocytes Absolute: 0.8 10*3/uL (ref 0.1–1.0)
NEUTROS ABS: 7.2 10*3/uL (ref 1.7–7.7)
Neutrophils Relative %: 73 % (ref 43–77)
Platelets: 230 10*3/uL (ref 150–400)
RBC: 4.48 MIL/uL (ref 4.22–5.81)
RDW: 13.4 % (ref 11.5–15.5)
WBC: 9.7 10*3/uL (ref 4.0–10.5)

## 2014-03-30 MED ORDER — CEPHALEXIN 500 MG PO CAPS
1000.0000 mg | ORAL_CAPSULE | Freq: Once | ORAL | Status: AC
  Administered 2014-03-31: 1000 mg via ORAL
  Filled 2014-03-30: qty 2

## 2014-03-30 MED ORDER — OXYCODONE-ACETAMINOPHEN 5-325 MG PO TABS: 1.0000 | ORAL_TABLET | ORAL | Status: DC | PRN

## 2014-03-30 MED ORDER — OXYCODONE-ACETAMINOPHEN 5-325 MG PO TABS
1.0000 | ORAL_TABLET | Freq: Once | ORAL | Status: AC
  Administered 2014-03-31: 1 via ORAL
  Filled 2014-03-30: qty 1

## 2014-03-30 MED ORDER — CEPHALEXIN 500 MG PO CAPS: 500.0000 mg | ORAL_CAPSULE | Freq: Four times a day (QID) | ORAL | Status: DC

## 2014-03-30 NOTE — ED Notes (Signed)
Dug a thorn out of my left knee. Now it is swollen, red, and hot to touch per pt.

## 2014-03-30 NOTE — Discharge Instructions (Signed)
Return if the redness or swelling expand beyond the line which was drawn. Return if you start running a fever.  Cellulitis Cellulitis is an infection of the skin and the tissue beneath it. The infected area is usually red and tender. Cellulitis occurs most often in the arms and lower legs.  CAUSES  Cellulitis is caused by bacteria that enter the skin through cracks or cuts in the skin. The most common types of bacteria that cause cellulitis are Staphylococcus and Streptococcus. SYMPTOMS   Redness and warmth.  Swelling.  Tenderness or pain.  Fever. DIAGNOSIS  Your caregiver can usually determine what is wrong based on a physical exam. Blood tests may also be done. TREATMENT  Treatment usually involves taking an antibiotic medicine. HOME CARE INSTRUCTIONS   Take your antibiotics as directed. Finish them even if you start to feel better.  Keep the infected arm or leg elevated to reduce swelling.  Apply a warm cloth to the affected area up to 4 times per day to relieve pain.  Only take over-the-counter or prescription medicines for pain, discomfort, or fever as directed by your caregiver.  Keep all follow-up appointments as directed by your caregiver. SEEK MEDICAL CARE IF:   You notice red streaks coming from the infected area.  Your red area gets larger or turns dark in color.  Your bone or joint underneath the infected area becomes painful after the skin has healed.  Your infection returns in the same area or another area.  You notice a swollen bump in the infected area.  You develop new symptoms. SEEK IMMEDIATE MEDICAL CARE IF:   You have a fever.  You feel very sleepy.  You develop vomiting or diarrhea.  You have a general ill feeling (malaise) with muscle aches and pains. MAKE SURE YOU:   Understand these instructions.  Will watch your condition.  Will get help right away if you are not doing well or get worse. Document Released: 08/21/2005 Document  Revised: 05/12/2012 Document Reviewed: 01/27/2012 [facility] Patient Information 2014 [place].  Cephalexin tablets or capsules What is this medicine? CEPHALEXIN (sef a LEX in) is a cephalosporin antibiotic. It is used to treat certain kinds of bacterial infections It will not work for colds, flu, or other viral infections. This medicine may be used for other purposes; ask your health care provider or pharmacist if you have questions. COMMON BRAND NAME(S): Biocef, Keflex, Keftab What should I tell my health care provider before I take this medicine? They need to know if you have any of these conditions: -kidney disease -stomach or intestine problems, especially colitis -an unusual or allergic reaction to cephalexin, other cephalosporins, penicillins, other antibiotics, medicines, foods, dyes or preservatives -pregnant or trying to get pregnant -breast-feeding How should I use this medicine? Take this medicine by mouth with a full glass of water. Follow the directions on the prescription label. This medicine can be taken with or without food. Take your medicine at regular intervals. Do not take your medicine more often than directed. Take all of your medicine as directed even if you think you are better. Do not skip doses or stop your medicine early. Talk to your pediatrician regarding the use of this medicine in children. While this drug may be prescribed for selected conditions, precautions do apply. Overdosage: If you think you have taken too much of this medicine contact a poison control center or emergency room at once. NOTE: This medicine is only for you. Do not share this medicine with  others. What if I miss a dose? If you miss a dose, take it as soon as you can. If it is almost time for your next dose, take only that dose. Do not take double or extra doses. There should be at least 4 to 6 hours between doses. What may interact with this medicine? -probenecid -some other  antibiotics This list may not describe all possible interactions. Give your health care provider a list of all the medicines, herbs, non-prescription drugs, or dietary supplements you use. Also tell them if you smoke, drink alcohol, or use illegal drugs. Some items may interact with your medicine. What should I watch for while using this medicine? Tell your doctor or health care professional if your symptoms do not begin to improve in a few days. Do not treat diarrhea with over the counter products. Contact your doctor if you have diarrhea that lasts more than 2 days or if it is severe and watery. If you have diabetes, you may get a false-positive result for sugar in your urine. Check with your doctor or health care professional. What side effects may I notice from receiving this medicine? Side effects that you should report to your doctor or health care professional as soon as possible: -allergic reactions like skin rash, itching or hives, swelling of the face, lips, or tongue -breathing problems -pain or trouble passing urine -redness, blistering, peeling or loosening of the skin, including inside the mouth -severe or watery diarrhea -unusually weak or tired -yellowing of the eyes, skin Side effects that usually do not require medical attention (report to your doctor or health care professional if they continue or are bothersome): -gas or heartburn -genital or anal irritation -headache -joint or muscle pain -nausea, vomiting This list may not describe all possible side effects. Call your doctor for medical advice about side effects. You may report side effects to FDA at 1-800-FDA-1088. Where should I keep my medicine? Keep out of the reach of children. Store at room temperature between 59 and 86 degrees F (15 and 30 degrees C). Throw away any unused medicine after the expiration date. NOTE: This sheet is a summary. It may not cover all possible information. If you have questions about this  medicine, talk to your doctor, pharmacist, or health care provider.  2014, [employer]vier/Gold Standard. (2008-02-15 17:09:13)  Acetaminophen; Oxycodone tablets What is this medicine? ACETAMINOPHEN; OXYCODONE (a set a MEE noe fen; ox i KOE done) is a pain reliever. It is used to treat mild to moderate pain. This medicine may be used for other purposes; ask your health care provider or pharmacist if you have questions. COMMON BRAND NAME(S): Endocet, Magnacet, Narvox, Percocet, Perloxx, Primalev, Primlev, Roxicet, Xolox What should I tell my health care provider before I take this medicine? They need to know if you have any of these conditions: -brain tumor -Crohn's disease, inflammatory bowel disease, or ulcerative colitis -drug abuse or addiction -head injury -heart or circulation problems -if you often drink alcohol -kidney disease or problems going to the bathroom -liver disease -lung disease, asthma, or breathing problems -an unusual or allergic reaction to acetaminophen, oxycodone, other opioid analgesics, other medicines, foods, dyes, or preservatives -pregnant or trying to get pregnant -breast-feeding How should I use this medicine? Take this medicine by mouth with a full glass of water. Follow the directions on the prescription label. Take your medicine at regular intervals. Do not take your medicine more often than directed. Talk to your pediatrician regarding the use of this medicine  in children. Special care may be needed. Patients over 40 years old may have a stronger reaction and need a smaller dose. Overdosage: If you think you have taken too much of this medicine contact a poison control center or emergency room at once. NOTE: This medicine is only for you. Do not share this medicine with others. What if I miss a dose? If you miss a dose, take it as soon as you can. If it is almost time for your next dose, take only that dose. Do not take double or extra doses. What may  interact with this medicine? -alcohol -antihistamines -barbiturates like amobarbital, butalbital, butabarbital, methohexital, pentobarbital, phenobarbital, thiopental, and secobarbital -benztropine -drugs for bladder problems like solifenacin, trospium, oxybutynin, tolterodine, hyoscyamine, and methscopolamine -drugs for breathing problems like ipratropium and tiotropium -drugs for certain stomach or intestine problems like propantheline, homatropine methylbromide, glycopyrrolate, atropine, belladonna, and dicyclomine -general anesthetics like etomidate, ketamine, nitrous oxide, propofol, desflurane, enflurane, halothane, isoflurane, and sevoflurane -medicines for depression, anxiety, or psychotic disturbances -medicines for sleep -muscle relaxants -naltrexone -narcotic medicines (opiates) for pain -phenothiazines like perphenazine, thioridazine, chlorpromazine, mesoridazine, fluphenazine, prochlorperazine, promazine, and trifluoperazine -scopolamine -tramadol -trihexyphenidyl This list may not describe all possible interactions. Give your health care provider a list of all the medicines, herbs, non-prescription drugs, or dietary supplements you use. Also tell them if you smoke, drink alcohol, or use illegal drugs. Some items may interact with your medicine. What should I watch for while using this medicine? Tell your doctor or health care professional if your pain does not go away, if it gets worse, or if you have new or a different type of pain. You may develop tolerance to the medicine. Tolerance means that you will need a higher dose of the medication for pain relief. Tolerance is normal and is expected if you take this medicine for a long time. Do not suddenly stop taking your medicine because you may develop a severe reaction. Your body becomes used to the medicine. This does NOT mean you are addicted. Addiction is a behavior related to getting and using a drug for a non-medical reason. If  you have pain, you have a medical reason to take pain medicine. Your doctor will tell you how much medicine to take. If your doctor wants you to stop the medicine, the dose will be slowly lowered over time to avoid any side effects. You may get drowsy or dizzy. Do not drive, use machinery, or do anything that needs mental alertness until you know how this medicine affects you. Do not stand or sit up quickly, especially if you are an older patient. This reduces the risk of dizzy or fainting spells. Alcohol may interfere with the effect of this medicine. Avoid alcoholic drinks. There are different types of narcotic medicines (opiates) for pain. If you take more than one type at the same time, you may have more side effects. Give your health care provider a list of all medicines you use. Your doctor will tell you how much medicine to take. Do not take more medicine than directed. Call emergency for help if you have problems breathing. The medicine will cause constipation. Try to have a bowel movement at least every 2 to 3 days. If you do not have a bowel movement for 3 days, call your doctor or health care professional. Do not take Tylenol (acetaminophen) or medicines that have acetaminophen with this medicine. Too much acetaminophen can be very dangerous. Many nonprescription medicines contain acetaminophen. Always read the labels  carefully to avoid taking more acetaminophen. What side effects may I notice from receiving this medicine? Side effects that you should report to your doctor or health care professional as soon as possible: -allergic reactions like skin rash, itching or hives, swelling of the face, lips, or tongue -breathing difficulties, wheezing -confusion -light headedness or fainting spells -severe stomach pain -unusually weak or tired -yellowing of the skin or the whites of the eyes  Side effects that usually do not require medical attention (report to your doctor or health care  professional if they continue or are bothersome): -dizziness -drowsiness -nausea -vomiting This list may not describe all possible side effects. Call your doctor for medical advice about side effects. You may report side effects to FDA at 1-800-FDA-1088. Where should I keep my medicine? Keep out of the reach of children. This medicine can be abused. Keep your medicine in a safe place to protect it from theft. Do not share this medicine with anyone. Selling or giving away this medicine is dangerous and against the law. Store at room temperature between 20 and 25 degrees C (68 and 77 degrees F). Keep container tightly closed. Protect from light. This medicine may cause accidental overdose and death if it is taken by other adults, children, or pets. Flush any unused medicine down the toilet to reduce the chance of harm. Do not use the medicine after the expiration date. NOTE: This sheet is a summary. It may not cover all possible information. If you have questions about this medicine, talk to your doctor, pharmacist, or health care provider.  2014, Elsevier/Gold Standard. (2013-07-05 13:17:35)

## 2014-03-30 NOTE — ED Provider Notes (Signed)
CSN: [id number]     Arrival date & time 03/30/14  2047 History  This chart was scribed for Delora Fuel, MD by Zettie Pho, ED Scribe. This patient was seen in room APA08/APA08 and the patient's care was started at 11:44 PM.    Chief Complaint  Patient presents with  . Leg Swelling  . Cellulitis   The history is provided by the patient. No language interpreter was used.   HPI Comments: Peter [name] is a 68 y.o. male who presents to the Emergency Department complaining of a constant pain, 8-9/10 currently, with associated swelling, erythema, and palpable warmth to the left knee onset a few days ago after he reports that he removed a thorn (from a plum tree) from the area. Patient states that his symptoms have been progressively worsening. He reports taking Tylenol at home with mild, temporary relief. Patient has a history of cancer.   Past Medical History  Diagnosis Date  . Cancer     small intestine   Past Surgical History  Procedure Laterality Date  . Appendectomy    . Small intestine surgery      removed due to cancer   No family history on file. History  Substance Use Topics  . Smoking status: Never Smoker   . Smokeless tobacco: Not on file  . Alcohol Use: No    Review of Systems  Musculoskeletal: Positive for arthralgias and joint swelling.  Skin: Positive for color change (erythema).  All other systems reviewed and are negative.     Allergies  Review of patient's allergies indicates no known allergies.  Home Medications   Prior to Admission medications   Medication Sig Start Date End Date Taking? Authorizing Provider  loperamide (IMODIUM A-D) 2 MG tablet Take 2 mg by mouth 4 (four) times daily as needed for diarrhea or loose stools.   Yes Historical Provider, MD   Triage Vitals: BP 135/65  Pulse 97  Temp(Src) 99.9 F (37.7 C) (Oral)  Resp 16  Ht 5\' 11"  (1.803 m)  Wt 177 lb (80.287 kg)  BMI 24.70 kg/m2  SpO2 98%  Physical Exam  Nursing note and vitals  reviewed. Constitutional: He is oriented to person, place, and time. He appears well-developed and well-nourished. No distress.  HENT:  Head: Normocephalic and atraumatic.  Eyes: Conjunctivae are normal. Pupils are equal, round, and reactive to light.  Neck: Normal range of motion. Neck supple. No JVD present.  Cardiovascular: Normal rate, regular rhythm and normal heart sounds.   No murmur heard. Pulmonary/Chest: Effort normal and breath sounds normal. No respiratory distress. He has no wheezes. He has no rales.  Pectus excavatum.   Abdominal: Bowel sounds are normal. He exhibits no distension and no mass. There is no tenderness.  Musculoskeletal: Normal range of motion. He exhibits no edema.  Erythema, warmth, slight swelling of the anterior aspect of the left knee. Small ulceration present. Area of erythema is 10 cm in diameter. No lymphangitic streaks.   Lymphadenopathy:    He has no cervical adenopathy.  Neurological: He is alert and oriented to person, place, and time. He has normal reflexes. No cranial nerve deficit. Coordination normal.  Skin: Skin is warm and dry. There is erythema.  Psychiatric: He has a normal mood and affect. His behavior is normal. Thought content normal.    ED Course  Procedures (including critical care time)  DIAGNOSTIC STUDIES: Oxygen Saturation is 98% on room air, normal by my interpretation.    COORDINATION OF CARE: 9:53  PM- Ordered CBC, BMP, and an x-ray of the left knee.   11:48 PM- Will start patient on a course of Keflex. Will order and discharge patient with Percocet to manage symptoms. Advised patient of further symptomatic care at home. Return precautions given. Discussed treatment plan with patient at bedside and patient verbalized agreement.     Labs Review Results for orders placed during the hospital encounter of 03/30/14  CBC WITH DIFFERENTIAL      Result Value Ref Range   WBC 9.7  4.0 - 10.5 K/uL   RBC 4.48  4.22 - 5.81 MIL/uL    Hemoglobin 13.7  13.0 - 17.0 g/dL   HCT 40.8  39.0 - 52.0 %   MCV 91.1  78.0 - 100.0 fL   MCH 30.6  26.0 - 34.0 pg   MCHC 33.6  30.0 - 36.0 g/dL   RDW 13.4  11.5 - 15.5 %   Platelets 230  150 - 400 K/uL   Neutrophils Relative % 73  43 - 77 %   Neutro Abs 7.2  1.7 - 7.7 K/uL   Lymphocytes Relative 17  12 - 46 %   Lymphs Abs 1.6  0.7 - 4.0 K/uL   Monocytes Relative 9  3 - 12 %   Monocytes Absolute 0.8  0.1 - 1.0 K/uL   Eosinophils Relative 1  0 - 5 %   Eosinophils Absolute 0.1  0.0 - 0.7 K/uL   Basophils Relative 0  0 - 1 %   Basophils Absolute 0.0  0.0 - 0.1 K/uL  BASIC METABOLIC PANEL      Result Value Ref Range   Sodium 139  137 - 147 mEq/L   Potassium 4.0  3.7 - 5.3 mEq/L   Chloride 103  96 - 112 mEq/L   CO2 24  19 - 32 mEq/L   Glucose, Bld 114 (*) 70 - 99 mg/dL   BUN 14  6 - 23 mg/dL   Creatinine, Ser 1.13  0.50 - 1.35 mg/dL   Calcium 8.7  8.4 - 10.5 mg/dL   GFR calc non Af Amer 65 (*) >90 mL/min   GFR calc Af Amer 75 (*) >90 mL/min   Imaging Review Dg Knee 2 Views Left  03/30/2014   CLINICAL DATA:  Pain, swelling  EXAM: LEFT KNEE - 1-2 VIEW  COMPARISON:  None.  FINDINGS: There is no evidence of fracture, dislocation, or joint effusion. There is no evidence of arthropathy or other focal bone abnormality. There is severe prepatellar soft tissue swelling.  IMPRESSION: 1. No acute osseous injury of the left knee. 2. Severe prepatellar soft tissue swelling.   Electronically Signed   By: Kathreen Devoid   On: 03/30/2014 22:40   Images viewed by me.  MDM   Final diagnoses:  Cellulitis of left knee    Cellulitis on the anterior aspect of the left knee. No evidence of any joint involvement. He does not appear toxic so she would be a good candidate for oral antibiotics at home. He is discharged with prescription for cephalexin and is to follow up with PCP in 2 days. The area of erythema was outlined in pen and is advised to return to the ED should he develop significant fever or  should the area of redness extends beyond the line that was drawn.  I personally performed the services described in this documentation, which was scribed in my presence. The recorded information has been reviewed and is accurate.  Delora Fuel, MD 09/81/19 [id number]

## 2014-03-31 ENCOUNTER — Encounter (HOSPITAL_COMMUNITY): Payer: Self-pay | Admitting: Emergency Medicine

## 2014-03-31 ENCOUNTER — Inpatient Hospital Stay (HOSPITAL_COMMUNITY)
Admission: EM | Admit: 2014-03-31 | Discharge: 2014-04-02 | DRG: 603 | Disposition: A | Discharge status: Home / Self Care | Payer: Medicare HMO | Attending: Internal Medicine | Admitting: Internal Medicine

## 2014-03-31 DIAGNOSIS — Z9049 Acquired absence of other specified parts of digestive tract: Secondary | ICD-10-CM

## 2014-03-31 DIAGNOSIS — Z91013 Allergy to seafood: Secondary | ICD-10-CM

## 2014-03-31 DIAGNOSIS — L02419 Cutaneous abscess of limb, unspecified: Principal | ICD-10-CM

## 2014-03-31 DIAGNOSIS — R197 Diarrhea, unspecified: Secondary | ICD-10-CM | POA: Diagnosis present

## 2014-03-31 DIAGNOSIS — J449 Chronic obstructive pulmonary disease, unspecified: Secondary | ICD-10-CM | POA: Diagnosis present

## 2014-03-31 DIAGNOSIS — Z833 Family history of diabetes mellitus: Secondary | ICD-10-CM

## 2014-03-31 DIAGNOSIS — J4489 Other specified chronic obstructive pulmonary disease: Secondary | ICD-10-CM | POA: Diagnosis present

## 2014-03-31 DIAGNOSIS — Z8509 Personal history of malignant neoplasm of other digestive organs: Secondary | ICD-10-CM

## 2014-03-31 DIAGNOSIS — L0390 Cellulitis, unspecified: Secondary | ICD-10-CM | POA: Diagnosis present

## 2014-03-31 DIAGNOSIS — E785 Hyperlipidemia, unspecified: Secondary | ICD-10-CM | POA: Diagnosis present

## 2014-03-31 DIAGNOSIS — L03119 Cellulitis of unspecified part of limb: Principal | ICD-10-CM

## 2014-03-31 DIAGNOSIS — L03116 Cellulitis of left lower limb: Secondary | ICD-10-CM | POA: Diagnosis present

## 2014-03-31 LAB — BASIC METABOLIC PANEL
BUN: 14 mg/dL (ref 6–23)
CALCIUM: 8.7 mg/dL (ref 8.4–10.5)
CO2: 23 mEq/L (ref 19–32)
CREATININE: 1.15 mg/dL (ref 0.50–1.35)
Chloride: 104 mEq/L (ref 96–112)
GFR, EST AFRICAN AMERICAN: 74 mL/min — AB (ref >90)
GFR, EST NON AFRICAN AMERICAN: 64 mL/min — AB (ref >90)
GLUCOSE: 104 mg/dL — AB (ref 70–99)
POTASSIUM: 4.5 meq/L (ref 3.7–5.3)
Sodium: 138 mEq/L (ref 137–147)

## 2014-03-31 LAB — CBC WITH DIFFERENTIAL/PLATELET
BASOS ABS: 0 10*3/uL (ref 0.0–0.1)
BASOS PCT: 0 % (ref 0–1)
EOS ABS: 0.1 10*3/uL (ref 0.0–0.7)
Eosinophils Relative: 1 % (ref 0–5)
HEMATOCRIT: 43.1 % (ref 39.0–52.0)
Hemoglobin: 14.5 g/dL (ref 13.0–17.0)
Lymphocytes Relative: 15 % (ref 12–46)
Lymphs Abs: 1.6 10*3/uL (ref 0.7–4.0)
MCH: 30.5 pg (ref 26.0–34.0)
MCHC: 33.6 g/dL (ref 30.0–36.0)
MCV: 90.7 fL (ref 78.0–100.0)
MONO ABS: 0.9 10*3/uL (ref 0.1–1.0)
Monocytes Relative: 9 % (ref 3–12)
NEUTROS ABS: 8.2 10*3/uL — AB (ref 1.7–7.7)
Neutrophils Relative %: 75 % (ref 43–77)
Platelets: 228 10*3/uL (ref 150–400)
RBC: 4.75 MIL/uL (ref 4.22–5.81)
RDW: 13.2 % (ref 11.5–15.5)
WBC: 10.8 10*3/uL — ABNORMAL HIGH (ref 4.0–10.5)

## 2014-03-31 LAB — LACTIC ACID, PLASMA: Lactic Acid, Venous: 1.3 mmol/L (ref 0.5–2.2)

## 2014-03-31 MED ORDER — ENOXAPARIN SODIUM 40 MG/0.4ML ~~LOC~~ SOLN
40.0000 mg | SUBCUTANEOUS | Status: DC
  Filled 2014-03-31 (×2): qty 0.4

## 2014-03-31 MED ORDER — CLINDAMYCIN PHOSPHATE 600 MG/50ML IV SOLN
INTRAVENOUS | Status: AC
  Filled 2014-03-31: qty 50

## 2014-03-31 MED ORDER — CLINDAMYCIN PHOSPHATE 600 MG/50ML IV SOLN
600.0000 mg | Freq: Three times a day (TID) | INTRAVENOUS | Status: DC
  Administered 2014-04-01 – 2014-04-02 (×5): 600 mg via INTRAVENOUS
  Filled 2014-03-31 (×11): qty 50

## 2014-03-31 MED ORDER — CLINDAMYCIN PHOSPHATE 600 MG/50ML IV SOLN
600.0000 mg | Freq: Once | INTRAVENOUS | Status: AC
  Administered 2014-03-31: 600 mg via INTRAVENOUS
  Filled 2014-03-31: qty 50

## 2014-03-31 MED ORDER — HYDROCODONE-ACETAMINOPHEN 5-325 MG PO TABS
1.0000 | ORAL_TABLET | ORAL | Status: DC | PRN
  Administered 2014-04-01: 1 via ORAL
  Filled 2014-03-31: qty 1
  Filled 2014-03-31: qty 2

## 2014-03-31 MED ORDER — ACETAMINOPHEN 325 MG PO TABS: 650.0000 mg | ORAL_TABLET | Freq: Four times a day (QID) | ORAL | Status: DC | PRN

## 2014-03-31 MED ORDER — SODIUM CHLORIDE 0.9 % IV SOLN
Freq: Once | INTRAVENOUS | Status: AC
  Administered 2014-03-31: 17:00:00 via INTRAVENOUS

## 2014-03-31 MED ORDER — LOPERAMIDE HCL 2 MG PO CAPS: 2.0000 mg | ORAL_CAPSULE | Freq: Four times a day (QID) | ORAL | Status: DC | PRN

## 2014-03-31 MED ORDER — ACETAMINOPHEN 650 MG RE SUPP: 650.0000 mg | Freq: Four times a day (QID) | RECTAL | Status: DC | PRN

## 2014-03-31 MED ORDER — SODIUM CHLORIDE 0.9 % IV SOLN
INTRAVENOUS | Status: AC
  Administered 2014-03-31: 23:00:00 via INTRAVENOUS

## 2014-03-31 NOTE — H&P (Signed)
[facility] History and Physical  Peter [name] UKG:[id number] DOB: 30-Oct-1946 DOA: 03/31/2014  Referring physician: Dr. Vanita [name] PCP: Peter Font, MD   Chief Complaint:  Pain and swelling of the left knee since 3 days  HPI:  68 year old male who was seen in the ED yesterday for painful wound in the left knee associated with warmth and redness was upper when a thorn puncture the area about 3 days back. She reports that the thorn likely was removed but had pain and redness over the anterior knee. He came to the ED and was discharged on oral Keflex. However at the area of the redness began to spread over the lateral knee and lower thigh. He also had a temperature of 102 Fahrenheit at home today and had difficulty ambulating. He denies any pain in the lower legs ankles or the hip. Denies any trauma.  Patient denies headache, dizziness, , nausea , vomiting, chest pain, palpitations, SOB, abdominal pain, bowel or urinary symptoms. 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   Course in the ED Patient had temperature of 99.9 Fahrenheit. Blood work done showed WBC of 10.8 and normal chemistry. X-ray of the left knee done yesterday evening showed severe prepatellar soft tissue swelling without any fracture, dislocation or joint effusion. Patient given a dose of IV clindamycin and hospitalists called for admission to medical floor.  Review of Systems:  Constitutional:  fever, chills, denies diaphoresis, appetite change and fatigue.  HEENT: Denies , congestion, sore throat, rhinorrhea, sneezing, mouth sores, trouble swallowing, neck pain, .   Respiratory: Denies SOB, DOE, cough, chest tightness,  and wheezing.   Cardiovascular: Denies chest pain, palpitations and leg swelling.  Gastrointestinal: Denies nausea, vomiting, abdominal pain, diarrhea, constipation, blood in stool and abdominal distention.  Genitourinary: Denies dysuria,  urgency, frequency, hematuria, flank pain and difficulty urinating.  Endocrine: Denies: hot or cold intolerance, sweats, changes in hair or nails, polyuria, polydipsia. Musculoskeletal: Pain in left knee and difficult ambulation, Denies myalgias, back pain,  Skin: Denies pallor, rash and wound.  Neurological: Denies dizziness, seizures, syncope, weakness, light-headedness, numbness and headaches.     Past Medical History  Diagnosis Date  . Cancer     small intestine   Past Surgical History  Procedure Laterality Date  . Appendectomy    . Small intestine surgery      removed due to cancer   Social History:  reports that he has never smoked. He has never used smokeless tobacco. He reports that he does not drink alcohol or use illicit drugs.  Allergies  Allergen Reactions  . Shellfish Allergy Swelling    Family History  Problem Relation Age of Onset  . Diabetes Other     Prior to Admission medications   Medication Sig Start Date End Date Taking? Authorizing Provider  cephALEXin (KEFLEX) 500 MG capsule Take 1 capsule (500 mg total) by mouth 4 (four) times daily. 05/31/27  Yes Peter Fuel, MD  loperamide (IMODIUM A-D) 2 MG tablet Take 2 mg by mouth 4 (four) times daily as needed for diarrhea or loose stools.   Yes Historical Provider, MD  oxyCODONE-acetaminophen (PERCOCET/ROXICET) 5-325 MG per tablet Take 1 tablet by mouth every 4 (four) hours as needed. 01/23/50  Yes Peter Fuel, MD     Physical Exam:  Filed Vitals:   03/31/14 1536 03/31/14 1803  BP: 115/67 103/55  Pulse:  83  Temp: 99 F (37.2 C)   TempSrc: Oral  Resp: 16 16  Height: 5\' 11"  (1.803 m)   Weight: 80.287 kg (177 lb)   SpO2: 100% 100%    Constitutional: Vital signs reviewed.  Patient is an elderly male in no acute distress. HEENT: no pallor, no icterus, moist oral mucosa,  Cardiovascular: RRR, S1 normal, S2 normal, no MRG Chest: CTAB, no wheezes, rales, or rhonchi Abdominal: Soft. Non-tender,  non-distended, bowel sounds are normal,  Ext: Erythema over anterior left knee spreading around to involve lower lateral thigh and lateral knee. ( worsened from marking yesterday), has warmth and tenderness to pressure. Has a central black punctate lesion without any discharge. Mild swelling over anterior knee with pain on full flexion .  Neurological: A&O x3, non focal  Labs on Admission:  Basic Metabolic Panel:  Recent Labs Lab 03/30/14 2155 03/31/14 1649  NA 139 138  K 4.0 4.5  CL 103 104  CO2 24 23  GLUCOSE 114* 104*  BUN 14 14  CREATININE 1.13 1.15  CALCIUM 8.7 8.7   Liver Function Tests: No results found for this basename: AST, ALT, ALKPHOS, BILITOT, PROT, ALBUMIN,  in the last 168 hours No results found for this basename: LIPASE, AMYLASE,  in the last 168 hours No results found for this basename: AMMONIA,  in the last 168 hours CBC:  Recent Labs Lab 03/30/14 2155 03/31/14 1648  WBC 9.7 10.8*  NEUTROABS 7.2 8.2*  HGB 13.7 14.5  HCT 40.8 43.1  MCV 91.1 90.7  PLT 230 228   Cardiac Enzymes: No results found for this basename: CKTOTAL, CKMB, CKMBINDEX, TROPONINI,  in the last 168 hours BNP: No components found with this basename: POCBNP,  CBG: No results found for this basename: GLUCAP,  in the last 168 hours  Radiological Exams on Admission: Dg Knee 2 Views Left  03/30/2014   CLINICAL DATA:  Pain, swelling  EXAM: LEFT KNEE - 1-2 VIEW  COMPARISON:  None.  FINDINGS: There is no evidence of fracture, dislocation, or joint effusion. There is no evidence of arthropathy or other focal bone abnormality. There is severe prepatellar soft tissue swelling.  IMPRESSION: 1. No acute osseous injury of the left knee. 2. Severe prepatellar soft tissue swelling.   Electronically Signed   By: Kathreen Devoid   On: 03/30/2014 22:40      Assessment/Plan  Principal Problem:   Cellulitis of knee, left Admit to MedSurg. Patient started on Keflex since yesterday but cellulitis  progressed. Patient given a dose of IV clindamycin in the ED. I will continue him on IV clindamycin 600 mg every 8 hours. -Tylenol when necessary for fever. Oxycodone when necessary for pain. -No signs of effusion or abscess on clinical exam and on x-ray.  Active Problems: History of small bowel cancer status post resection. Has chronic diarrhea. Continue Imodium    Diet: regular  DVT prophylaxis: sq lovenox   Code Status: full code Family Communication: discussed with wife at bedside Disposition Plan: home Once improved likely  in 1-2 days  Zaria Taha [facility] Pager [phone]  Total time spent on admission :55 minutes  If 7PM-7AM, please contact night-coverage [email or web address] Password [email or web address] 03/31/2014, 7:12 PM

## 2014-03-31 NOTE — ED Notes (Signed)
Patient seen here last night for wound to left knee. Per patient "thorn from plum tree punctured skin." Patient has redness and streaking from site, redness outside circle drawn on knee last night. Painful and warm to touch.

## 2014-03-31 NOTE — ED Notes (Signed)
Pt waiting to be evaluated for admission. Pt given dinner tray

## 2014-03-31 NOTE — ED Notes (Signed)
EDP back in to re eval. Pt to be admit

## 2014-03-31 NOTE — ED Provider Notes (Signed)
CSN: [id number]     Arrival date & time 03/31/14  1531 History  This chart was scribed for Peter Muskrat, MD by Rolanda Lundborg, ED Scribe. This patient was seen in room APA07/APA07 and the patient's care was started at 4:03 PM.    Chief Complaint  Patient presents with  . Wound Infection   The history is provided by the patient. No language interpreter was used.   HPI Comments: Peter [name] is a 68 y.o. male who presents to the Emergency Department complaining of a painful wound to the left knee with associated warmth and redness from when a thorn punctured the area 3 days ago. He reports fever of 102. He states he tried to dig the thorn out at home but is unsure if it came out. Pt was seen here last night by Dr Roxanne Mins for the same. X-rays were done. He reports nausea but no vomiting. He denies numbness in the foot. He denies trouble ambulating. He denies CP SOB. He denies pain in the foot ankle or hip.    Past Medical History  Diagnosis Date  . Cancer     small intestine   Past Surgical History  Procedure Laterality Date  . Appendectomy    . Small intestine surgery      removed due to cancer   Family History  Problem Relation Age of Onset  . Diabetes Other    History  Substance Use Topics  . Smoking status: Never Smoker   . Smokeless tobacco: Never Used  . Alcohol Use: No    Review of Systems  Constitutional:       Per HPI, otherwise negative  HENT:       Per HPI, otherwise negative  Respiratory:       Per HPI, otherwise negative  Cardiovascular:       Per HPI, otherwise negative  Gastrointestinal: Negative for vomiting.  Endocrine:       Negative aside from HPI  Genitourinary:       Neg aside from HPI   Musculoskeletal:       Per HPI, otherwise negative  Skin: Negative.   Neurological: Negative for syncope.      Allergies  Shellfish allergy  Home Medications   Prior to Admission medications   Medication Sig Start Date End Date Taking? Authorizing  Provider  cephALEXin (KEFLEX) 500 MG capsule Take 1 capsule (500 mg total) by mouth 4 (four) times daily. 6/0/73   Delora Fuel, MD  loperamide (IMODIUM A-D) 2 MG tablet Take 2 mg by mouth 4 (four) times daily as needed for diarrhea or loose stools.    Historical Provider, MD  oxyCODONE-acetaminophen (PERCOCET/ROXICET) 5-325 MG per tablet Take 1 tablet by mouth every 4 (four) hours as needed for severe pain. 05/25/05   Delora Fuel, MD  oxyCODONE-acetaminophen (PERCOCET/ROXICET) 5-325 MG per tablet Take 1 tablet by mouth every 4 (four) hours as needed. 12/31/92   Delora Fuel, MD   BP 854/62  Temp(Src) 99 F (37.2 C) (Oral)  Resp 16  Ht 5\' 11"  (1.803 m)  Wt 177 lb (80.287 kg)  BMI 24.70 kg/m2  SpO2 100% Physical Exam  Nursing note and vitals reviewed. Constitutional: He is oriented to person, place, and time. He appears well-developed. No distress.  HENT:  Head: Normocephalic and atraumatic.  Eyes: Conjunctivae and EOM are normal.  Cardiovascular: Normal rate and regular rhythm.   Pulmonary/Chest: Effort normal. No stridor. No respiratory distress.  Abdominal: He exhibits no distension.  Musculoskeletal: He  exhibits no edema.  Warm erythematous patch around the entirety of the anterior knee with central area of .5 cm black punctate lesion. NVI throughout the leg.  Neurological: He is alert and oriented to person, place, and time.  Skin: Skin is warm and dry.  Psychiatric: He has a normal mood and affect.    ED Course  Procedures (including critical care time) Medications  0.9 %  sodium chloride infusion ( Intravenous New Bag/Given 03/31/14 1658)  clindamycin (CLEOCIN) IVPB 600 mg (0 mg Intravenous Stopped 03/31/14 1730)    DIAGNOSTIC STUDIES: Oxygen Saturation is 100% on RA, normal by my interpretation.    COORDINATION OF CARE: 4:24 PM- Discussed treatment plan with pt which includes blood work and IV antibiotics. Pt agrees to plan.    Labs Review Labs Reviewed  CBC WITH  DIFFERENTIAL - Abnormal; Notable for the following:    WBC 10.8 (*)    Neutro Abs 8.2 (*)    All other components within normal limits  BASIC METABOLIC PANEL - Abnormal; Notable for the following:    Glucose, Bld 104 (*)    GFR calc non Af Amer 64 (*)    GFR calc Af Amer 74 (*)    All other components within normal limits  LACTIC ACID, PLASMA    Imaging Review Dg Knee 2 Views Left  03/30/2014   CLINICAL DATA:  Pain, swelling  EXAM: LEFT KNEE - 1-2 VIEW  COMPARISON:  None.  FINDINGS: There is no evidence of fracture, dislocation, or joint effusion. There is no evidence of arthropathy or other focal bone abnormality. There is severe prepatellar soft tissue swelling.  IMPRESSION: 1. No acute osseous injury of the left knee. 2. Severe prepatellar soft tissue swelling.   Electronically Signed   By: Kathreen Devoid   On: 03/30/2014 22:40   Exam the patient is in no distress, we discussed all findings.  He continues to c/o pain in the knee. MDM   I personally performed the services described in this documentation, which was scribed in my presence. The recorded information has been reviewed and is accurate.   Patient presents one day after initial evaluation for puncture wound, diagnosed with cellulitis, now with worsening symptoms.  There is no evidence of sepsis, but with leukocytosis, fever, spreading lesion, patient received IV antibiotics, was admitted for cellulitis.  Peter Muskrat, MD 03/31/14 [phone]

## 2014-04-01 ENCOUNTER — Encounter (HOSPITAL_COMMUNITY): Payer: Self-pay | Admitting: *Deleted

## 2014-04-01 DIAGNOSIS — L0291 Cutaneous abscess, unspecified: Secondary | ICD-10-CM

## 2014-04-01 DIAGNOSIS — J449 Chronic obstructive pulmonary disease, unspecified: Secondary | ICD-10-CM

## 2014-04-01 DIAGNOSIS — L0390 Cellulitis, unspecified: Secondary | ICD-10-CM

## 2014-04-01 LAB — CBC
HCT: 41.4 % (ref 39.0–52.0)
Hemoglobin: 13.8 g/dL (ref 13.0–17.0)
MCH: 30.3 pg (ref 26.0–34.0)
MCHC: 33.3 g/dL (ref 30.0–36.0)
MCV: 90.8 fL (ref 78.0–100.0)
PLATELETS: 258 10*3/uL (ref 150–400)
RBC: 4.56 MIL/uL (ref 4.22–5.81)
RDW: 13.3 % (ref 11.5–15.5)
WBC: 9.9 10*3/uL (ref 4.0–10.5)

## 2014-04-01 NOTE — Progress Notes (Signed)
UR chart review completed.  

## 2014-04-01 NOTE — Care Management Note (Signed)
    Page 1 of 1   04/01/2014     11:37:09 AM CARE MANAGEMENT NOTE 04/01/2014  Patient:  Peter [name], Peter [name]   Account Number:  [id number]  Date Initiated:  04/01/2014  Documentation initiated by:  Theophilus Kinds  Subjective/Objective Assessment:   Pt admitted from home with cellulitis/wound infection. Pt lives with his wife and will return home at discharge. Pt is independent with ADL's.     Action/Plan:   No CM needs noted.   Anticipated DC Date:  04/04/2014   Anticipated DC Plan:  [place]  CM consult      Choice offered to / List presented to:             Status of service:  Completed, signed off Medicare Important Message given?  YES (If response is "NO", the following Medicare IM given date fields will be blank) Date Medicare IM given:  04/01/2014 Date Additional Medicare IM given:    Discharge Disposition:  HOME/SELF CARE  Per UR Regulation:    If discussed at Long Length of Stay Meetings, dates discussed:    Comments:  04/01/14 [place], RN BSN CM

## 2014-04-01 NOTE — Progress Notes (Signed)
[facility] PROGRESS NOTE  Peter [name] KDX:[id number] DOB: Aug 12, 1946 DOA: 03/31/2014 PCP: [name], MD  Assessment/Plan: 1. Cellulitis of left knee. Patient is currently on IV clindamycin. His erythema appears to be significantly improved. He does not have any pain in his knee. No significant effusion was appreciated. We'll continue with current treatments. If he continues to improve, can anticipate changing to oral clindamycin. 2. History of COPD, stable.  Code Status: Full code Family Communication: No family present Disposition Plan: Discharge home, possibly in a.m.   Consultants:    Procedures:    Antibiotics:  Clindamycin 5/7>>  HPI/Subjective: Feeling better. He feels erythema is improving. Has increased mobility of the joint.  Objective: Filed Vitals:   04/01/14 1502  BP: 108/66  Pulse: 77  Temp: 97.9 F (36.6 C)  Resp: 16    Intake/Output Summary (Last 24 hours) at 04/01/14 1905 Last data filed at 04/01/14 1742  Gross per 24 hour  Intake    150 ml  Output    500 ml  Net   -350 ml   Filed Weights   03/31/14 1536  Weight: 80.287 kg (177 lb)    Exam:   General:  NAD  Cardiovascular: S1, S2 RRR  Respiratory: CTA B  Abdomen: soft, nt, nd, bs+  Musculoskeletal: erythema over left knee is greatly improved when compared to outlined margins. No drainage noted from central puncture wound. No joint effusion noted.  Full range of motion noted in joint.   Data Reviewed: Basic Metabolic Panel:  Recent Labs Lab 03/30/14 2155 03/31/14 1649  NA 139 138  K 4.0 4.5  CL 103 104  CO2 24 23  GLUCOSE 114* 104*  BUN 14 14  CREATININE 1.13 1.15  CALCIUM 8.7 8.7   Liver Function Tests: No results found for this basename: AST, ALT, ALKPHOS, BILITOT, PROT, ALBUMIN,  in the last 168 hours No results found for this basename: LIPASE, AMYLASE,  in the last 168 hours No results found for this basename: AMMONIA,  in the last 168  hours CBC:  Recent Labs Lab 03/30/14 2155 03/31/14 1648 04/01/14 0506  WBC 9.7 10.8* 9.9  NEUTROABS 7.2 8.2*  --   HGB 13.7 14.5 13.8  HCT 40.8 43.1 41.4  MCV 91.1 90.7 90.8  PLT 230 228 258   Cardiac Enzymes: No results found for this basename: CKTOTAL, CKMB, CKMBINDEX, TROPONINI,  in the last 168 hours BNP (last 3 results) No results found for this basename: PROBNP,  in the last 8760 hours CBG: No results found for this basename: GLUCAP,  in the last 168 hours  No results found for this or any previous visit (from the past 240 hour(s)).   Studies: Dg Knee 2 Views Left  03/30/2014   CLINICAL DATA:  Pain, swelling  EXAM: LEFT KNEE - 1-2 VIEW  COMPARISON:  None.  FINDINGS: There is no evidence of fracture, dislocation, or joint effusion. There is no evidence of arthropathy or other focal bone abnormality. There is severe prepatellar soft tissue swelling.  IMPRESSION: 1. No acute osseous injury of the left knee. 2. Severe prepatellar soft tissue swelling.   Electronically Signed   By: Kathreen Devoid   On: 03/30/2014 22:40    Scheduled Meds: . clindamycin (CLEOCIN) IV  600 mg Intravenous 3 times per day  . enoxaparin (LOVENOX) injection  40 mg Subcutaneous Q24H   Continuous Infusions:   Principal Problem:   Cellulitis of knee, left Active Problems:   HYPERLIPIDEMIA   COPD  Diarrhea   Cellulitis    Time spent: 76mins    [name]  [facility] Pager [phone]. If 7PM-7AM, please contact night-coverage at [email or web address], password [facility] 04/01/2014, 7:05 PM  LOS: 1 day

## 2014-04-02 MED ORDER — CLINDAMYCIN HCL 300 MG PO CAPS: 300.0000 mg | ORAL_CAPSULE | Freq: Four times a day (QID) | ORAL | Status: DC

## 2014-04-02 MED ORDER — SACCHAROMYCES BOULARDII 250 MG PO CAPS: 250.0000 mg | ORAL_CAPSULE | Freq: Two times a day (BID) | ORAL | Status: DC

## 2014-04-02 NOTE — Progress Notes (Signed)
IV removed, site WNL.  Pt given d/c instructions and new prescriptions.  Discussed all home medications (when, how, and why to take), patient verbalizes understanding.  Stressed importance of taking entire dose as prescribed of antibiotics. Discussed home care with patient, teachback completed. F/U appointment to be made by patient when office opens, states he will make and keep appointment. Pt is stable at this time. Pt taken to main entrance in wheelchair by staff member.

## 2014-04-02 NOTE — Discharge Instructions (Signed)
Cellulitis Cellulitis is an infection of the skin and the tissue beneath it. The infected area is usually red and tender. Cellulitis occurs most often in the arms and lower legs.  CAUSES  Cellulitis is caused by bacteria that enter the skin through cracks or cuts in the skin. The most common types of bacteria that cause cellulitis are Staphylococcus and Streptococcus. SYMPTOMS   Redness and warmth.  Swelling.  Tenderness or pain.  Fever. DIAGNOSIS  Your caregiver can usually determine what is wrong based on a physical exam. Blood tests may also be done. TREATMENT  Treatment usually involves taking an antibiotic medicine. HOME CARE INSTRUCTIONS   Take your antibiotics as directed. Finish them even if you start to feel better.  Keep the infected arm or leg elevated to reduce swelling.  Apply a warm cloth to the affected area up to 4 times per day to relieve pain.  Only take over-the-counter or prescription medicines for pain, discomfort, or fever as directed by your caregiver.  Keep all follow-up appointments as directed by your caregiver. SEEK MEDICAL CARE IF:   You notice red streaks coming from the infected area.  Your red area gets larger or turns dark in color.  Your bone or joint underneath the infected area becomes painful after the skin has healed.  Your infection returns in the same area or another area.  You notice a swollen bump in the infected area.  You develop new symptoms. SEEK IMMEDIATE MEDICAL CARE IF:   You have a fever.  You feel very sleepy.  You develop vomiting or diarrhea.  You have a general ill feeling (malaise) with muscle aches and pains. MAKE SURE YOU:   Understand these instructions.  Will watch your condition.  Will get help right away if you are not doing well or get worse. Document Released: 08/21/2005 Document Revised: 05/12/2012 Document Reviewed: 01/27/2012 [employer] Patient Information 2014 [employer].  

## 2014-04-02 NOTE — Discharge Summary (Signed)
Physician Discharge Summary  Peter [name]:[id number] DOB: 06-07-1946 DOA: 03/31/2014  PCP: [name], MD  Admit date: 03/31/2014 Discharge date: 04/02/2014  Time spent: 40 minutes  Recommendations for Outpatient Follow-up:  1. Discharge home 2. Follow up with primary care doctor in 1-2 weeks to follow up on cellulitis  Discharge Diagnoses:  Principal Problem:   Cellulitis of knee, left Active Problems:   HYPERLIPIDEMIA   COPD   Diarrhea   Cellulitis   Discharge Condition: improved  Diet recommendation: low salt  Filed Weights   03/31/14 1536  Weight: 80.287 kg (177 lb)    History of present illness:  68 year old male who was seen in the ED yesterday for painful wound in the left knee associated with warmth and redness was upper when a thorn puncture the area about 3 days back. She reports that the thorn likely was removed but had pain and redness over the anterior knee. He came to the ED and was discharged on oral Keflex. However at the area of the redness began to spread over the lateral knee and lower thigh. He also had a temperature of 102 Fahrenheit at home today and had difficulty ambulating. He denies any pain in the lower legs ankles or the hip. Denies any trauma.  Patient denies headache, dizziness, , nausea , vomiting, chest pain, palpitations, SOB, abdominal pain, bowel or urinary symptoms. 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  Course in the ED  Patient had temperature of 99.9 Fahrenheit. Blood work done showed WBC of 10.8 and normal chemistry. X-ray of the left knee done yesterday evening showed severe prepatellar soft tissue swelling without any fracture, dislocation or joint effusion.  Patient given a dose of IV clindamycin and hospitalists called for admission to medical floor.   Hospital Course:  This patient was admitted to the hospital with worsening cellulitis over his left knee.  He was  working in his garden when he suffered a puncture wound to his left knee from a tree thorn.  He was initially started on keflex, but when he failed to improve, he came back to the ED for evaluation.  He was not found to have any joint effusion and did not have any pain with ROM. It did not appear that the infection had joint involvement, and appeared to remain superficial.  He was started on IV clindamycin and quickly improved.  Erythema is now nearly resolved.  He does not have any leukocytosis or fever.  He will be transitioned to oral antibiotics for a total of 7 days. He has been asked to follow up with his PCP in 1 week for re evaluation of his knee.  Procedures:    Consultations:    Discharge Exam: Filed Vitals:   04/02/14 0633  BP: 108/65  Pulse: 76  Temp: 98.6 F (37 C)  Resp: 16    General: NAD Cardiovascular: S1, S2 RRR Respiratory: CTA B MSK: left knee erythema has significantly improved.  Wound has scabbed over, no drainage noted. No effusion appreciated.  Discharge Instructions You were cared for by a hospitalist during your hospital stay. If you have any questions about your discharge medications or the care you received while you were in the hospital after you are discharged, you can call the unit and asked to speak with the hospitalist on call if the hospitalist that took care of you is not available. Once you are discharged, your primary care physician will  handle any further medical issues. Please note that NO REFILLS for any discharge medications will be authorized once you are discharged, as it is imperative that you return to your primary care physician (or establish a relationship with a primary care physician if you do not have one) for your aftercare needs so that they can reassess your need for medications and monitor your lab values.  Discharge Orders   Future Orders Complete By Expires   Call MD for:  redness, tenderness, or signs of infection (pain, swelling,  redness, odor or green/yellow discharge around incision site)  As directed    Call MD for:  severe uncontrolled pain  As directed    Call MD for:  temperature >100.4  As directed    Diet - low sodium heart healthy  As directed    Increase activity slowly  As directed        Medication List    STOP taking these medications       cephALEXin 500 MG capsule  Commonly known as:  KEFLEX      TAKE these medications       clindamycin 300 MG capsule  Commonly known as:  CLEOCIN  Take 1 capsule (300 mg total) by mouth 4 (four) times daily.     loperamide 2 MG tablet  Commonly known as:  IMODIUM A-D  Take 2 mg by mouth 4 (four) times daily as needed for diarrhea or loose stools.     oxyCODONE-acetaminophen 5-325 MG per tablet  Commonly known as:  PERCOCET/ROXICET  Take 1 tablet by mouth every 4 (four) hours as needed.     saccharomyces boulardii 250 MG capsule  Commonly known as:  FLORASTOR  Take 1 capsule (250 mg total) by mouth 2 (two) times daily.       Allergies  Allergen Reactions  . Eggs Or Egg-Derived Products     Pt says he doesn't eat eggs due to his cancer.  Eggs work against the cancer per patient  . Shellfish Allergy Swelling       Follow-up Information   Follow up with HILL,GERALD K, MD. Schedule an appointment as soon as possible for a visit in 1 week.   Specialty:  Family Medicine   Contact information:   [address] [place] [address] [phone]        The results of significant diagnostics from this hospitalization (including imaging, microbiology, ancillary and laboratory) are listed below for reference.    Significant Diagnostic Studies: Dg Knee 2 Views Left  03/30/2014   CLINICAL DATA:  Pain, swelling  EXAM: LEFT KNEE - 1-2 VIEW  COMPARISON:  None.  FINDINGS: There is no evidence of fracture, dislocation, or joint effusion. There is no evidence of arthropathy or other focal bone abnormality. There is severe prepatellar soft tissue  swelling.  IMPRESSION: 1. No acute osseous injury of the left knee. 2. Severe prepatellar soft tissue swelling.   Electronically Signed   By: Kathreen Devoid   On: 03/30/2014 22:40    Microbiology: No results found for this or any previous visit (from the past 240 hour(s)).   Labs: Basic Metabolic Panel:  Recent Labs Lab 03/30/14 2155 03/31/14 1649  NA 139 138  K 4.0 4.5  CL 103 104  CO2 24 23  GLUCOSE 114* 104*  BUN 14 14  CREATININE 1.13 1.15  CALCIUM 8.7 8.7   Liver Function Tests: No results found for this basename: AST, ALT, ALKPHOS, BILITOT, PROT, ALBUMIN,  in the last 168 hours No results found for this basename: LIPASE, AMYLASE,  in the last 168 hours No results found for this basename: AMMONIA,  in the last 168 hours CBC:  Recent Labs Lab 03/30/14 2155 03/31/14 1648 04/01/14 0506  WBC 9.7 10.8* 9.9  NEUTROABS 7.2 8.2*  --   HGB 13.7 14.5 13.8  HCT 40.8 43.1 41.4  MCV 91.1 90.7 90.8  PLT 230 228 258   Cardiac Enzymes: No results found for this basename: CKTOTAL, CKMB, CKMBINDEX, TROPONINI,  in the last 168 hours BNP: BNP (last 3 results) No results found for this basename: PROBNP,  in the last 8760 hours CBG: No results found for this basename: GLUCAP,  in the last 168 hours     Signed:  Kathie Dike  [facility] 04/02/2014, 2:34 PM

## 2014-12-01 ENCOUNTER — Encounter: Payer: Self-pay | Admitting: Internal Medicine

## 2014-12-22 ENCOUNTER — Ambulatory Visit: Payer: Medicare HMO | Admitting: Nurse Practitioner

## 2015-01-10 ENCOUNTER — Encounter: Payer: Self-pay | Admitting: Nurse Practitioner

## 2015-01-30 ENCOUNTER — Encounter: Payer: Self-pay | Admitting: Nurse Practitioner

## 2015-01-30 ENCOUNTER — Other Ambulatory Visit: Payer: Self-pay

## 2015-01-30 ENCOUNTER — Telehealth: Payer: Self-pay | Admitting: Nurse Practitioner

## 2015-01-30 ENCOUNTER — Ambulatory Visit (INDEPENDENT_AMBULATORY_CARE_PROVIDER_SITE_OTHER): Payer: Medicare HMO | Admitting: Nurse Practitioner

## 2015-01-30 VITALS — BP 120/74 | HR 79 | Temp 97.6°F | Ht 70.0 in | Wt 158.0 lb

## 2015-01-30 DIAGNOSIS — R195 Other fecal abnormalities: Secondary | ICD-10-CM

## 2015-01-30 MED ORDER — PEG 3350-KCL-NA BICARB-NACL 420 G PO SOLR: 4000.0000 mL | Freq: Once | ORAL | Status: DC

## 2015-01-30 NOTE — Assessment & Plan Note (Addendum)
69 year old male with a history of intestinal cancer and intestinal resection. Seen by PCP for routine exam and noted heme+ stool. Deneis any overt GI bleed. Wife recently passed away. No red flag/warning signs. Last colonoscopy at [facility] as inpatient with normal colonic mucosa, no polyps. No family history of colon cancer. Will request records from surgical procedure. At this time will move forweard with repeat TCS for heme+ stool.  Proceed with TCS with Dr. Gala Romney in near future: the risks, benefits, and alternatives have been discussed with the patient in detail. The patient states understanding and desires to proceed.

## 2015-01-30 NOTE — Progress Notes (Signed)
Primary Care Physician:  [name], MD Primary Gastroenterologist:  Dr. Gala Romney  Chief Complaint  Patient presents with  . Blood In Stools    HPI:   69 year old male here for evaluation of rectal bleeding and on referral from PCP for heme+ stools. Last colonoscopy 2010 at [facility] for screening and evaluation of intestinal mass. Rectal mucosa normal at that time with no polyps. History of small intestinal cancer.   Today he states his PCP visit in mid-December had heme+ stool on exam, no previous heme+ stool. He deneis abdominal pain, hematochezia, and melena. Deneis N/V. 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 Has a bowel movement multiple times a day and is usually loose. Denies fever, chills. Possibly some recent unintentional weight loss. Denies recent change in appetite. Denies chest pain, palpitations, syncope, near syncope, lightheadedness, dizziness, worsening shortness of breath.   Past Medical History  Diagnosis Date  . Cancer 2010    small intestine  . Food poisoning     Past Surgical History  Procedure Laterality Date  . Appendectomy    . Small intestine surgery  2010    removed due to cancer    Current Outpatient Prescriptions  Medication Sig Dispense Refill  . loperamide (IMODIUM A-D) 2 MG tablet Take 2 mg by mouth 4 (four) times daily as needed for diarrhea or loose stools.    . polyethylene glycol-electrolytes (NULYTELY/GOLYTELY) 420 G solution Take 4,000 mLs by mouth once. 4000 mL 0   No current facility-administered medications for this visit.    Allergies as of 01/30/2015 - Review Complete 01/30/2015  Allergen Reaction Noted  . Eggs or egg-derived products  03/31/2014  . Shellfish allergy Swelling 03/31/2014    Family History  Problem Relation Age of Onset  . Diabetes Other   . Colon cancer Neg Hx   . Lung cancer Brother     History   Social History  . Marital Status: Married    Spouse Name: N/A    . Number of Children: N/A  . Years of Education: N/A   Occupational History  . Not on file.   Social History Main Topics  . Smoking status: Former Smoker    Quit date: 11/25/1989  . Smokeless tobacco: Never Used  . Alcohol Use: 0.0 oz/week    0 Standard drinks or equivalent per week     Comment: rarely/occasional  . Drug Use: No  . Sexual Activity: Not on file   Other Topics Concern  . Not on file   Social History Narrative    Review of Systems: Gen: Denies any fever, chills, fatigue, weight loss, lack of appetite.  CV: Denies chest pain, heart palpitations, peripheral edema, syncope.  Resp: Denies shortness of breath at rest or with exertion. Denies wheezing.  GI: See HPI. Denies dysphagia or odynophagia. Denies jaundice, hematemesis, fecal incontinence. Derm: Denies rash, itching, dry skin Psych: Denies memory loss, and confusion Heme: Denies bruising, bleeding, and enlarged lymph nodes.  Physical Exam: BP 120/74 mmHg  Pulse 79  Temp(Src) 97.6 F (36.4 C) (Oral)  Ht 5\' 10"  (1.778 m)  Wt 158 lb (71.668 kg)  BMI 22.67 kg/m2 General:   Alert and oriented. Pleasant and cooperative. Well-nourished and well-developed.  Head:  Normocephalic and atraumatic. Eyes:  Without icterus, sclera clear and conjunctiva pink.  Ears:  Normal auditory acuity. Mouth:  No deformity or lesions, oral mucosa pink. No OP edema. Neck:  Supple, without mass  or thyromegaly. Lungs:  Clear to auscultation bilaterally. No wheezes, rales, or rhonchi. No distress.  Heart:  S1, S2 present without murmurs appreciated.  Abdomen:  +BS, soft, non-tender and non-distended. No HSM noted. No guarding or rebound. No masses appreciated.  Rectal:  Deferred  Msk:  Symmetrical without gross deformities. Normal posture. Extremities:  Without clubbing or edema. Neurologic:  Alert and  oriented x4;  grossly normal neurologically. Skin:  Intact without significant lesions or rashes. Abdominal scar noted around  the umbilicus consistent with surgical history. Psych:  Alert and cooperative. Normal mood and affect.     01/30/2015 9:23 AM

## 2015-01-30 NOTE — Patient Instructions (Signed)
1. We will schedule you for your procedure (colonoscopy) 2. Further recommendations to be based on the results of your procedure.

## 2015-01-30 NOTE — Progress Notes (Signed)
cc'ed to pcp °

## 2015-02-03 NOTE — Telephone Encounter (Signed)
Opened in error

## 2015-02-15 ENCOUNTER — Ambulatory Visit (HOSPITAL_COMMUNITY)
Admission: RE | Admit: 2015-02-15 | Discharge: 2015-02-15 | Disposition: A | Discharge status: Home / Self Care | Payer: Medicare HMO | Source: Ambulatory Visit | Attending: Internal Medicine | Admitting: Internal Medicine

## 2015-02-15 ENCOUNTER — Encounter (HOSPITAL_COMMUNITY): Payer: Self-pay | Admitting: *Deleted

## 2015-02-15 ENCOUNTER — Encounter (HOSPITAL_COMMUNITY)
Admission: RE | Disposition: A | Discharge status: Home / Self Care | Payer: Self-pay | Source: Ambulatory Visit | Attending: Internal Medicine

## 2015-02-15 DIAGNOSIS — Z87891 Personal history of nicotine dependence: Secondary | ICD-10-CM | POA: Diagnosis not present

## 2015-02-15 DIAGNOSIS — Z79899 Other long term (current) drug therapy: Secondary | ICD-10-CM | POA: Insufficient documentation

## 2015-02-15 DIAGNOSIS — Z91013 Allergy to seafood: Secondary | ICD-10-CM | POA: Diagnosis not present

## 2015-02-15 DIAGNOSIS — K6289 Other specified diseases of anus and rectum: Secondary | ICD-10-CM | POA: Diagnosis not present

## 2015-02-15 DIAGNOSIS — K625 Hemorrhage of anus and rectum: Secondary | ICD-10-CM | POA: Diagnosis present

## 2015-02-15 DIAGNOSIS — Z85038 Personal history of other malignant neoplasm of large intestine: Secondary | ICD-10-CM | POA: Diagnosis not present

## 2015-02-15 DIAGNOSIS — K648 Other hemorrhoids: Secondary | ICD-10-CM | POA: Diagnosis not present

## 2015-02-15 DIAGNOSIS — R195 Other fecal abnormalities: Secondary | ICD-10-CM | POA: Diagnosis not present

## 2015-02-15 DIAGNOSIS — Z91012 Allergy to eggs: Secondary | ICD-10-CM | POA: Insufficient documentation

## 2015-02-15 HISTORY — PX: COLONOSCOPY: SHX5424

## 2015-02-15 LAB — CBC
HEMATOCRIT: 39.6 % (ref 39.0–52.0)
Hemoglobin: 13.1 g/dL (ref 13.0–17.0)
MCH: 29.8 pg (ref 26.0–34.0)
MCHC: 33.1 g/dL (ref 30.0–36.0)
MCV: 90.2 fL (ref 78.0–100.0)
PLATELETS: 324 10*3/uL (ref 150–400)
RBC: 4.39 MIL/uL (ref 4.22–5.81)
RDW: 12.9 % (ref 11.5–15.5)
WBC: 7.9 10*3/uL (ref 4.0–10.5)

## 2015-02-15 SURGERY — COLONOSCOPY
Anesthesia: Moderate Sedation

## 2015-02-15 MED ORDER — ONDANSETRON HCL 4 MG/2ML IJ SOLN
INTRAMUSCULAR | Status: DC | PRN
  Administered 2015-02-15: 4 mg via INTRAVENOUS

## 2015-02-15 MED ORDER — STERILE WATER FOR IRRIGATION IR SOLN
Status: DC | PRN
  Administered 2015-02-15: 11:00:00

## 2015-02-15 MED ORDER — ONDANSETRON HCL 4 MG/2ML IJ SOLN
INTRAMUSCULAR | Status: AC
  Filled 2015-02-15: qty 2

## 2015-02-15 MED ORDER — MEPERIDINE HCL 100 MG/ML IJ SOLN
INTRAMUSCULAR | Status: DC | PRN
  Administered 2015-02-15: 25 mg via INTRAVENOUS
  Administered 2015-02-15: 50 mg via INTRAVENOUS

## 2015-02-15 MED ORDER — MIDAZOLAM HCL 5 MG/5ML IJ SOLN
INTRAMUSCULAR | Status: AC
  Filled 2015-02-15: qty 10

## 2015-02-15 MED ORDER — SODIUM CHLORIDE 0.9 % IV SOLN
INTRAVENOUS | Status: DC
  Administered 2015-02-15: 1000 mL via INTRAVENOUS

## 2015-02-15 MED ORDER — MEPERIDINE HCL 100 MG/ML IJ SOLN
INTRAMUSCULAR | Status: AC
  Filled 2015-02-15: qty 2

## 2015-02-15 MED ORDER — MIDAZOLAM HCL 5 MG/5ML IJ SOLN
INTRAMUSCULAR | Status: DC | PRN
  Administered 2015-02-15: 1 mg via INTRAVENOUS
  Administered 2015-02-15: 2 mg via INTRAVENOUS
  Administered 2015-02-15: 1 mg via INTRAVENOUS

## 2015-02-15 NOTE — H&P (View-Only) (Signed)
Primary Care Physician:  [name], MD Primary Gastroenterologist:  Dr. Gala Romney  Chief Complaint  Patient presents with  . Blood In Stools    HPI:   69 year old male here for evaluation of rectal bleeding and on referral from PCP for heme+ stools. Last colonoscopy 2010 at [facility] for screening and evaluation of intestinal mass. Rectal mucosa normal at that time with no polyps. History of small intestinal cancer.   Today he states his PCP visit in mid-December had heme+ stool on exam, no previous heme+ stool. He deneis abdominal pain, hematochezia, and melena. Deneis N/V. 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 Has a bowel movement multiple times a day and is usually loose. Denies fever, chills. Possibly some recent unintentional weight loss. Denies recent change in appetite. Denies chest pain, palpitations, syncope, near syncope, lightheadedness, dizziness, worsening shortness of breath.   Past Medical History  Diagnosis Date  . Cancer 2010    small intestine  . Food poisoning     Past Surgical History  Procedure Laterality Date  . Appendectomy    . Small intestine surgery  2010    removed due to cancer    Current Outpatient Prescriptions  Medication Sig Dispense Refill  . loperamide (IMODIUM A-D) 2 MG tablet Take 2 mg by mouth 4 (four) times daily as needed for diarrhea or loose stools.    . polyethylene glycol-electrolytes (NULYTELY/GOLYTELY) 420 G solution Take 4,000 mLs by mouth once. 4000 mL 0   No current facility-administered medications for this visit.    Allergies as of 01/30/2015 - Review Complete 01/30/2015  Allergen Reaction Noted  . Eggs or egg-derived products  03/31/2014  . Shellfish allergy Swelling 03/31/2014    Family History  Problem Relation Age of Onset  . Diabetes Other   . Colon cancer Neg Hx   . Lung cancer Brother     History   Social History  . Marital Status: Married    Spouse Name: N/A    . Number of Children: N/A  . Years of Education: N/A   Occupational History  . Not on file.   Social History Main Topics  . Smoking status: Former Smoker    Quit date: 11/25/1989  . Smokeless tobacco: Never Used  . Alcohol Use: 0.0 oz/week    0 Standard drinks or equivalent per week     Comment: rarely/occasional  . Drug Use: No  . Sexual Activity: Not on file   Other Topics Concern  . Not on file   Social History Narrative    Review of Systems: Gen: Denies any fever, chills, fatigue, weight loss, lack of appetite.  CV: Denies chest pain, heart palpitations, peripheral edema, syncope.  Resp: Denies shortness of breath at rest or with exertion. Denies wheezing.  GI: See HPI. Denies dysphagia or odynophagia. Denies jaundice, hematemesis, fecal incontinence. Derm: Denies rash, itching, dry skin Psych: Denies memory loss, and confusion Heme: Denies bruising, bleeding, and enlarged lymph nodes.  Physical Exam: BP 120/74 mmHg  Pulse 79  Temp(Src) 97.6 F (36.4 C) (Oral)  Ht 5\' 10"  (1.778 m)  Wt 158 lb (71.668 kg)  BMI 22.67 kg/m2 General:   Alert and oriented. Pleasant and cooperative. Well-nourished and well-developed.  Head:  Normocephalic and atraumatic. Eyes:  Without icterus, sclera clear and conjunctiva pink.  Ears:  Normal auditory acuity. Mouth:  No deformity or lesions, oral mucosa pink. No OP edema. Neck:  Supple, without mass  or thyromegaly. Lungs:  Clear to auscultation bilaterally. No wheezes, rales, or rhonchi. No distress.  Heart:  S1, S2 present without murmurs appreciated.  Abdomen:  +BS, soft, non-tender and non-distended. No HSM noted. No guarding or rebound. No masses appreciated.  Rectal:  Deferred  Msk:  Symmetrical without gross deformities. Normal posture. Extremities:  Without clubbing or edema. Neurologic:  Alert and  oriented x4;  grossly normal neurologically. Skin:  Intact without significant lesions or rashes. Abdominal scar noted around  the umbilicus consistent with surgical history. Psych:  Alert and cooperative. Normal mood and affect.     01/30/2015 9:23 AM

## 2015-02-15 NOTE — Interval H&P Note (Signed)
History and Physical Interval Note:  02/15/2015 10:48 AM  Peter [name]  has presented today for surgery, with the diagnosis of heme positive stool  The various methods of treatment have been discussed with the patient and family. After consideration of risks, benefits and other options for treatment, the patient has consented to  Procedure(s) with comments: COLONOSCOPY (N/A) - 1130 - moved to 11:00 - Candy to notify pt as a surgical intervention .  The patient's history has been reviewed, patient examined, no change in status, stable for surgery.  I have reviewed the patient's chart and labs.  Questions were answered to the patient's satisfaction.     Peter [name]  No change. 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 Had 4 feet of his intestines removed. Colonoscopy today per plan.The risks, benefits, limitations, alternatives and imponderables have been reviewed with the patient. Questions have been answered. All parties are agreeable.

## 2015-02-15 NOTE — Op Note (Addendum)
[facility] [address] [place], [address]   COLONOSCOPY PROCEDURE REPORT  PATIENT: Peter, [name]  MR#: [id number] BIRTHDATE: 1946-05-25 , 68  yrs. old GENDER: male ENDOSCOPIST: R.  Garfield Cornea, MD FACP [facility] REFERRED XB:JYNWG Everette Rank, M.D. PROCEDURE DATE:  2015/03/05 PROCEDURE:   Colonoscopy, diagnostic INDICATIONS:Hemoccult-positive stool; history of a carcinoid tumor with small bowel resection previously.  No GI symptoms currently.  MEDICATIONS: Versed 4 mg IV and Demerol 75 mg IV in divided doses. Zofran 4 mg IV. ASA CLASS:       Class II  CONSENT: The risks, benefits, alternatives and imponderables including but not limited to bleeding, perforation as well as the possibility of a missed lesion have been reviewed.  The potential for biopsy, lesion removal, etc. have also been discussed. Questions have been answered.  All parties agreeable.  Please see the history and physical in the medical record for more information.  DESCRIPTION OF PROCEDURE:   After the risks benefits and alternatives of the procedure were thoroughly explained, informed consent was obtained.  The digital rectal exam revealed no abnormalities of the rectum.   The [id number] ([id number])  endoscope was introduced through the anus and advanced to the terminal ileum which was intubated for a short distance. No adverse events experienced.   The quality of the prep was adequate  The instrument was then slowly withdrawn as the colon was fully examined.      COLON FINDINGS: Anal papilla and internal hemorrhoids; otherwise, normal rectum mucosa.  Normal-appearing colonic mucosa. Cecum/appendiceal orifice/ileocecal valve intact.  Examination of the distal 5 cm of terminal ileal mucosa reveal no abnormalities. Retroflexion was performed. .  Withdrawal time=7 minutes 0 seconds.  The scope was withdrawn and the procedure completed. COMPLICATIONS: There were no immediate  complications.  ENDOSCOPIC IMPRESSION: Normal ileal colonoscopy  RECOMMENDATIONS: Will retrieve records from [facility] regarding his evaluation over there. He will need further evaluation of his Hemoccult positive stool in the near future.     CBC today.  eSigned:  R. Garfield Cornea, MD Rosalita Chessman [facility] 03/05/2015 11:16 AM   cc:  CPT CODES: ICD CODES:  The ICD and CPT codes recommended by this software are interpretations from the data that the clinical staff has captured with the software.  The verification of the translation of this report to the ICD and CPT codes and modifiers is the sole responsibility of the health care institution and practicing physician where this report was generated.  [place]. will not be held responsible for the validity of the ICD and CPT codes included on this report.  AMA assumes no liability for data contained or not contained herein. CPT is a Designer, television/film set of the [employer].

## 2015-02-15 NOTE — Discharge Instructions (Signed)
°  Colonoscopy Discharge Instructions  Read the instructions outlined below and refer to this sheet in the next few weeks. These discharge instructions provide you with general information on caring for yourself after you leave the hospital. Your doctor may also give you specific instructions. While your treatment has been planned according to the most current medical practices available, unavoidable complications occasionally occur. If you have any problems or questions after discharge, call Dr. Gala Romney at [phone]. ACTIVITY  You may resume your regular activity, but move at a slower pace for the next 24 hours.   Take frequent rest periods for the next 24 hours.   Walking will help get rid of the air and reduce the bloated feeling in your belly (abdomen).   No driving for 24 hours (because of the medicine (anesthesia) used during the test).    Do not sign any important legal documents or operate any machinery for 24 hours (because of the anesthesia used during the test).  NUTRITION  Drink plenty of fluids.   You may resume your normal diet as instructed by your doctor.   Begin with a light meal and progress to your normal diet. Heavy or fried foods are harder to digest and may make you feel sick to your stomach (nauseated).   Avoid alcoholic beverages for 24 hours or as instructed.  MEDICATIONS  You may resume your normal medications unless your doctor tells you otherwise.  WHAT YOU CAN EXPECT TODAY  Some feelings of bloating in the abdomen.   Passage of more gas than usual.   Spotting of blood in your stool or on the toilet paper.  IF YOU HAD POLYPS REMOVED DURING THE COLONOSCOPY:  No aspirin products for 7 days or as instructed.   No alcohol for 7 days or as instructed.   Eat a soft diet for the next 24 hours.  FINDING OUT THE RESULTS OF YOUR TEST Not all test results are available during your visit. If your test results are not back during the visit, make an appointment  with your caregiver to find out the results. Do not assume everything is normal if you have not heard from your caregiver or the medical facility. It is important for you to follow up on all of your test results.  SEEK IMMEDIATE MEDICAL ATTENTION IF:  You have more than a spotting of blood in your stool.   Your belly is swollen (abdominal distention).   You are nauseated or vomiting.   You have a temperature over 101.   You have abdominal pain or discomfort that is severe or gets worse throughout the day.   The colonoscopy was normal today  He will need further evaluation of blood in the stool  CBC today  Further recommendations to follow pending review of records from [facility]

## 2015-02-16 ENCOUNTER — Encounter (HOSPITAL_COMMUNITY): Payer: Self-pay | Admitting: Internal Medicine

## 2015-02-20 NOTE — Progress Notes (Signed)
I requested these on March 8th from PCP and [facility]. I will check EG box to see if he has them. If not, I will resend the request.

## 2015-09-01 DIAGNOSIS — R1012 Left upper quadrant pain: Secondary | ICD-10-CM | POA: Diagnosis not present

## 2015-09-01 DIAGNOSIS — Z23 Encounter for immunization: Secondary | ICD-10-CM | POA: Diagnosis not present

## 2015-09-01 DIAGNOSIS — E340 Carcinoid syndrome: Secondary | ICD-10-CM | POA: Diagnosis not present

## 2015-09-01 DIAGNOSIS — C7A019 Malignant carcinoid tumor of the small intestine, unspecified portion: Secondary | ICD-10-CM | POA: Diagnosis not present

## 2015-09-25 DIAGNOSIS — J449 Chronic obstructive pulmonary disease, unspecified: Secondary | ICD-10-CM | POA: Diagnosis not present

## 2015-09-25 DIAGNOSIS — Z6822 Body mass index (BMI) 22.0-22.9, adult: Secondary | ICD-10-CM | POA: Diagnosis not present

## 2015-09-25 DIAGNOSIS — E785 Hyperlipidemia, unspecified: Secondary | ICD-10-CM | POA: Diagnosis not present

## 2015-10-31 DIAGNOSIS — H25813 Combined forms of age-related cataract, bilateral: Secondary | ICD-10-CM | POA: Diagnosis not present

## 2015-10-31 DIAGNOSIS — H2510 Age-related nuclear cataract, unspecified eye: Secondary | ICD-10-CM | POA: Diagnosis not present

## 2015-10-31 DIAGNOSIS — H5200 Hypermetropia, unspecified eye: Secondary | ICD-10-CM | POA: Diagnosis not present

## 2015-10-31 DIAGNOSIS — Z0100 Encounter for examination of eyes and vision without abnormal findings: Secondary | ICD-10-CM | POA: Diagnosis not present

## 2016-01-24 DIAGNOSIS — E785 Hyperlipidemia, unspecified: Secondary | ICD-10-CM | POA: Diagnosis not present

## 2016-01-24 DIAGNOSIS — E705 Disorders of tryptophan metabolism: Secondary | ICD-10-CM | POA: Diagnosis not present

## 2016-01-24 DIAGNOSIS — M130 Polyarthritis, unspecified: Secondary | ICD-10-CM | POA: Diagnosis not present

## 2016-01-24 DIAGNOSIS — J449 Chronic obstructive pulmonary disease, unspecified: Secondary | ICD-10-CM | POA: Diagnosis not present

## 2016-02-23 DIAGNOSIS — Z91041 Radiographic dye allergy status: Secondary | ICD-10-CM | POA: Diagnosis not present

## 2016-02-23 DIAGNOSIS — Z9889 Other specified postprocedural states: Secondary | ICD-10-CM | POA: Diagnosis not present

## 2016-02-23 DIAGNOSIS — Z85060 Personal history of malignant carcinoid tumor of small intestine: Secondary | ICD-10-CM | POA: Diagnosis not present

## 2016-02-23 DIAGNOSIS — E340 Carcinoid syndrome: Secondary | ICD-10-CM | POA: Diagnosis not present

## 2016-02-23 DIAGNOSIS — C7A019 Malignant carcinoid tumor of the small intestine, unspecified portion: Secondary | ICD-10-CM | POA: Diagnosis not present

## 2016-02-23 DIAGNOSIS — Z08 Encounter for follow-up examination after completed treatment for malignant neoplasm: Secondary | ICD-10-CM | POA: Diagnosis not present

## 2016-04-29 DIAGNOSIS — Z79899 Other long term (current) drug therapy: Secondary | ICD-10-CM | POA: Diagnosis not present

## 2016-04-29 DIAGNOSIS — J449 Chronic obstructive pulmonary disease, unspecified: Secondary | ICD-10-CM | POA: Diagnosis not present

## 2016-04-29 DIAGNOSIS — E785 Hyperlipidemia, unspecified: Secondary | ICD-10-CM | POA: Diagnosis not present

## 2016-04-29 DIAGNOSIS — E705 Disorders of tryptophan metabolism: Secondary | ICD-10-CM | POA: Diagnosis not present

## 2016-06-20 DIAGNOSIS — R634 Abnormal weight loss: Secondary | ICD-10-CM | POA: Diagnosis not present

## 2016-06-20 DIAGNOSIS — H8309 Labyrinthitis, unspecified ear: Secondary | ICD-10-CM | POA: Diagnosis not present

## 2016-06-20 DIAGNOSIS — R197 Diarrhea, unspecified: Secondary | ICD-10-CM | POA: Diagnosis not present

## 2016-06-20 DIAGNOSIS — Z79899 Other long term (current) drug therapy: Secondary | ICD-10-CM | POA: Diagnosis not present

## 2016-07-31 DIAGNOSIS — Z6821 Body mass index (BMI) 21.0-21.9, adult: Secondary | ICD-10-CM | POA: Diagnosis not present

## 2016-07-31 DIAGNOSIS — J449 Chronic obstructive pulmonary disease, unspecified: Secondary | ICD-10-CM | POA: Diagnosis not present

## 2016-07-31 DIAGNOSIS — E340 Carcinoid syndrome: Secondary | ICD-10-CM | POA: Diagnosis not present

## 2016-09-06 DIAGNOSIS — Z9889 Other specified postprocedural states: Secondary | ICD-10-CM | POA: Diagnosis not present

## 2016-09-06 DIAGNOSIS — Z23 Encounter for immunization: Secondary | ICD-10-CM | POA: Diagnosis not present

## 2016-09-06 DIAGNOSIS — C7A019 Malignant carcinoid tumor of the small intestine, unspecified portion: Secondary | ICD-10-CM | POA: Diagnosis not present

## 2016-09-06 DIAGNOSIS — E340 Carcinoid syndrome: Secondary | ICD-10-CM | POA: Diagnosis not present

## 2016-09-06 DIAGNOSIS — Z888 Allergy status to other drugs, medicaments and biological substances status: Secondary | ICD-10-CM | POA: Diagnosis not present

## 2016-09-06 DIAGNOSIS — C7A095 Malignant carcinoid tumor of the midgut NOS: Secondary | ICD-10-CM | POA: Diagnosis not present

## 2016-10-31 DIAGNOSIS — Z87891 Personal history of nicotine dependence: Secondary | ICD-10-CM | POA: Diagnosis not present

## 2016-10-31 DIAGNOSIS — R1013 Epigastric pain: Secondary | ICD-10-CM | POA: Diagnosis not present

## 2016-11-01 ENCOUNTER — Encounter (HOSPITAL_COMMUNITY): Payer: Self-pay | Admitting: Emergency Medicine

## 2016-11-01 ENCOUNTER — Emergency Department (HOSPITAL_COMMUNITY): Payer: Medicare HMO

## 2016-11-01 ENCOUNTER — Emergency Department (HOSPITAL_COMMUNITY)
Admission: EM | Admit: 2016-11-01 | Discharge: 2016-11-01 | Disposition: A | Discharge status: Home / Self Care | Payer: Medicare HMO | Attending: Physician Assistant | Admitting: Physician Assistant

## 2016-11-01 DIAGNOSIS — Z87891 Personal history of nicotine dependence: Secondary | ICD-10-CM | POA: Diagnosis not present

## 2016-11-01 DIAGNOSIS — J449 Chronic obstructive pulmonary disease, unspecified: Secondary | ICD-10-CM | POA: Insufficient documentation

## 2016-11-01 DIAGNOSIS — N200 Calculus of kidney: Secondary | ICD-10-CM

## 2016-11-01 DIAGNOSIS — Z85068 Personal history of other malignant neoplasm of small intestine: Secondary | ICD-10-CM | POA: Insufficient documentation

## 2016-11-01 DIAGNOSIS — R1013 Epigastric pain: Secondary | ICD-10-CM | POA: Diagnosis not present

## 2016-11-01 DIAGNOSIS — R109 Unspecified abdominal pain: Secondary | ICD-10-CM | POA: Diagnosis present

## 2016-11-01 LAB — COMPREHENSIVE METABOLIC PANEL
ALK PHOS: 59 U/L (ref 38–126)
ALT: 18 U/L (ref 17–63)
AST: 20 U/L (ref 15–41)
Albumin: 3.7 g/dL (ref 3.5–5.0)
Anion gap: 7 (ref 5–15)
BUN: 20 mg/dL (ref 6–20)
CALCIUM: 8.9 mg/dL (ref 8.9–10.3)
CO2: 29 mmol/L (ref 22–32)
CREATININE: 1.17 mg/dL (ref 0.61–1.24)
Chloride: 100 mmol/L — ABNORMAL LOW (ref 101–111)
Glucose, Bld: 120 mg/dL — ABNORMAL HIGH (ref 65–99)
Potassium: 4.1 mmol/L (ref 3.5–5.1)
SODIUM: 136 mmol/L (ref 135–145)
Total Bilirubin: 1.3 mg/dL — ABNORMAL HIGH (ref 0.3–1.2)
Total Protein: 7.8 g/dL (ref 6.5–8.1)

## 2016-11-01 LAB — CBC
HCT: 45.4 % (ref 39.0–52.0)
Hemoglobin: 15.1 g/dL (ref 13.0–17.0)
MCH: 29.9 pg (ref 26.0–34.0)
MCHC: 33.3 g/dL (ref 30.0–36.0)
MCV: 89.9 fL (ref 78.0–100.0)
Platelets: 252 10*3/uL (ref 150–400)
RBC: 5.05 MIL/uL (ref 4.22–5.81)
RDW: 12.9 % (ref 11.5–15.5)
WBC: 8.9 10*3/uL (ref 4.0–10.5)

## 2016-11-01 LAB — URINALYSIS, ROUTINE W REFLEX MICROSCOPIC
BILIRUBIN URINE: NEGATIVE
Glucose, UA: NEGATIVE mg/dL
HGB URINE DIPSTICK: NEGATIVE
KETONES UR: NEGATIVE mg/dL
Leukocytes, UA: NEGATIVE
Nitrite: NEGATIVE
Protein, ur: NEGATIVE mg/dL
Specific Gravity, Urine: 1.02 (ref 1.005–1.030)
pH: 5 (ref 5.0–8.0)

## 2016-11-01 LAB — LIPASE, BLOOD: Lipase: 23 U/L (ref 11–51)

## 2016-11-01 MED ORDER — MORPHINE SULFATE (PF) 4 MG/ML IV SOLN
4.0000 mg | Freq: Once | INTRAVENOUS | Status: AC
  Administered 2016-11-01: 4 mg via INTRAVENOUS
  Filled 2016-11-01: qty 1

## 2016-11-01 MED ORDER — SODIUM CHLORIDE 0.9 % IJ SOLN
INTRAMUSCULAR | Status: AC
  Administered 2016-11-01: 15:00:00
  Filled 2016-11-01: qty 50

## 2016-11-01 MED ORDER — IOPAMIDOL (ISOVUE-300) INJECTION 61%
100.0000 mL | Freq: Once | INTRAVENOUS | Status: AC | PRN
  Administered 2016-11-01: 100 mL via INTRAVENOUS

## 2016-11-01 MED ORDER — GI COCKTAIL ~~LOC~~
30.0000 mL | Freq: Once | ORAL | Status: AC
  Administered 2016-11-01: 30 mL via ORAL
  Filled 2016-11-01: qty 30

## 2016-11-01 MED ORDER — TAMSULOSIN HCL 0.4 MG PO CAPS: 0.4000 mg | ORAL_CAPSULE | Freq: Every day | ORAL | 0 refills | Status: DC

## 2016-11-01 MED ORDER — OXYCODONE-ACETAMINOPHEN 5-325 MG PO TABS: 1.0000 | ORAL_TABLET | Freq: Four times a day (QID) | ORAL | 0 refills | Status: AC | PRN

## 2016-11-01 MED ORDER — SODIUM CHLORIDE 0.9 % IV BOLUS (SEPSIS)
1000.0000 mL | Freq: Once | INTRAVENOUS | Status: AC
  Administered 2016-11-01: 1000 mL via INTRAVENOUS

## 2016-11-01 MED ORDER — IOPAMIDOL (ISOVUE-300) INJECTION 61%
INTRAVENOUS | Status: AC
  Filled 2016-11-01: qty 100

## 2016-11-01 NOTE — ED Triage Notes (Signed)
Patient reports constant sharp left sided abdominal pain since Saturday. Denies N/V/D. States he was seen at [facility] yesterday for same, dx with gastritis, and taking Carafate and Protonix with no relief.

## 2016-11-01 NOTE — Discharge Instructions (Signed)
You have an 75mm kidney stone on the left side.  You may be able to pass the stone but please followup with urologist next week for further care.  Return if your condition is not well manage despite pain medication.

## 2016-11-01 NOTE — ED Provider Notes (Signed)
[facility] DEPT Provider Note   CSN: [id number] Arrival date & time: 11/01/16  1238     History   Chief Complaint Chief Complaint  Patient presents with  . Abdominal Pain    HPI Peter [name] is a 70 y.o. male.  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 Patient report for the past 5 days he has had persistent abdominal pain. Pain initially started in his mid abdomen but now spread towards the left side of his abdomen. He is unable to quantify his pain" it just hurts". He felt that eating may worsen his pain so he hasn't eat much. Pain is moderate to severe. There is no associated fever, lightheadedness, dizziness, chest pain, shortness of breath, productive cough, hemoptysis, nausea vomiting, or rash. 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  Patient was seen at [facility] ER yesterday for same. He had a workup including CBC, H. pylori, UA, CMP, PT/PTT, lactic acid, lipase without any concerning finding. Was diagnosed with gastritis. He did receive GI cocktail and symptomatic treatment but states that it did not provide adequate relief. He is currently requesting for additional imaging for further evaluation of his abdominal pain because it felt similar to prior cancer pain that had 8 years ago.  Past Medical History:  Diagnosis Date  . Cancer ([place]) 2010   small intestine  . Food poisoning     Patient Active Problem List   Diagnosis Date Noted  . Heme + stool 01/30/2015  . Cellulitis 03/31/2014  . Cellulitis of knee, left 03/31/2014  . [place] ALLERGY 05/24/2009  . HIP PAIN, RIGHT 02/27/2009  . 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 WEIGHT LOSS, RECENT 01/16/2009  . Diarrhea 01/16/2009  . SHOULDER PAIN, RIGHT 12/12/2008  . MALNUTRITION 02/12/2008  . OTHER ANXIETY STATES 02/12/2008  . ERECTILE  DYSFUNCTION 01/05/2007  . HYPERLIPIDEMIA 11/29/2006  . COPD 11/29/2006  . ARTHRITIS 11/29/2006  . LOW BACK PAIN 11/29/2006    Past Surgical History:  Procedure Laterality Date  . APPENDECTOMY    . COLONOSCOPY N/A 02/15/2015   Procedure: COLONOSCOPY;  Surgeon: Daneil Dolin, MD;  Location: [facility]ITE;  Service: Endoscopy;  Laterality: N/A;  1130 - moved to 11:00 - [place] to notify pt  . SMALL INTESTINE SURGERY  2010   removed due to cancer       Home Medications    Prior to Admission medications   Medication Sig Start Date End Date Taking? Authorizing Provider  loperamide (IMODIUM A-D) 2 MG tablet Take 2 mg by mouth 4 (four) times daily as needed for diarrhea or loose stools.   Yes Historical Provider, MD  sucralfate (CARAFATE) 1 g tablet Take 1 g by mouth 3 (three) times daily. 10/31/16  Yes Historical Provider, MD    Family History Family History  Problem Relation Age of Onset  . Diabetes Other   . Lung cancer Brother   . Colon cancer Neg Hx     Social History Social History  Substance Use Topics  . Smoking status: Former Smoker    Quit date: 11/25/1989  . Smokeless tobacco: Never Used  . Alcohol use 0.0 oz/week     Comment: rarely/occasional     Allergies   Eggs or egg-derived products and Shellfish allergy   Review of Systems Review of Systems  All other systems reviewed and are negative.  Physical Exam Updated Vital Signs BP 118/70 (BP Location: Left Arm)   Pulse 100   Temp 98 F (36.7 C) (Oral)   Resp 18   Ht 5\' 11"  (1.803 m)   Wt 71.2 kg   SpO2 98%   BMI 21.90 kg/m   Physical Exam  Constitutional: He appears well-developed and well-nourished. No distress.  HENT:  Head: Atraumatic.  Eyes: Conjunctivae are normal.  Neck: Neck supple.  Cardiovascular:  Mild  tachycardia without murmurs rubs or gallops  Pulmonary/Chest:  Decreased breath sounds without rales/wronchi/wheezes  Abdominal: Soft. He exhibits no distension. There is  tenderness (Diffuse abdominal tenderness on gentle palpation with guarding but no rebound tenderness).  Genitourinary:  Genitourinary Comments: No CVA tenderness  Neurological: He is alert.  Skin: No rash noted.  Psychiatric: He has a normal mood and affect.  Nursing note and vitals reviewed.    ED Treatments / Results  Labs (all labs ordered are listed, but only abnormal results are displayed) Labs Reviewed  COMPREHENSIVE METABOLIC PANEL - Abnormal; Notable for the following:       Result Value   Chloride 100 (*)    Glucose, Bld 120 (*)    Total Bilirubin 1.3 (*)    All other components within normal limits  LIPASE, BLOOD  CBC  URINALYSIS, ROUTINE W REFLEX MICROSCOPIC    EKG  EKG Interpretation None       Radiology Ct Abdomen Pelvis W Contrast  Result Date: 11/01/2016 CLINICAL DATA:  Left-sided abdominal pain for several days, initial encounter EXAM: CT ABDOMEN AND PELVIS WITH CONTRAST TECHNIQUE: Multidetector CT imaging of the abdomen and pelvis was performed using the standard protocol following bolus administration of intravenous contrast. CONTRAST:  176mL ISOVUE-300 IOPAMIDOL (ISOVUE-300) INJECTION 61% COMPARISON:  10/26/2008 FINDINGS: Lower chest: No acute abnormality. Hepatobiliary: No focal liver abnormality is seen. No gallstones, gallbladder wall thickening, or biliary dilatation. Pancreas: Unremarkable. No pancreatic ductal dilatation or surrounding inflammatory changes. Spleen: Normal in size without focal abnormality. Adrenals/Urinary Tract: The adrenal glands are within normal limits. The right kidney is unremarkable. The left kidney demonstrates evidence of parapelvic cysts as well as lower pole stone which measures approximately 8 mm. Mild inflammatory changes are noted surrounding the proximal ureter which may be root related to transient obstructive change. Alternatively a smaller stone may have passed recently. The bladder is within normal limits. Stomach/Bowel:  The appendix has been surgically removed. No obstructive changes are noted. No inflammatory changes are seen. Vascular/Lymphatic: Aortic atherosclerosis. No enlarged abdominal or pelvic lymph nodes. Reproductive: Prostate is unremarkable. Other: No abdominal wall hernia or abnormality. No abdominopelvic ascites. Musculoskeletal: Degenerative changes of the lumbar spine are noted. IMPRESSION: 8 mm stone in the lower pole of the left kidney. Some mild inflammatory changes are noted around the collecting system and proximal left ureter. There may be a transient degree of obstruction secondary to the 8 mm stone moving in and out of the renal pelvis. Alternatively these changes could be related to recently passed stone. No other focal abnormality is seen. Electronically Signed   By: Inez Catalina M.D.   On: 11/01/2016 14:54    Procedures Procedures (including critical care time)  Medications Ordered in ED Medications - No data to display   Initial Impression / Assessment and Plan / ED Course  I have reviewed the triage vital signs and the nursing notes.  Pertinent labs & imaging results that were available during my care of the patient were reviewed by me and considered in  my medical decision making (see chart for details).  Clinical Course     BP 123/73 (BP Location: Left Arm)   Pulse 69   Temp 98 F (36.7 C) (Oral)   Resp 18   Ht 5\' 11"  (1.803 m)   Wt 71.2 kg   SpO2 100%   BMI 21.90 kg/m    Final Clinical Impressions(s) / ED Diagnoses   Final diagnoses:  Kidney stone on left side    New Prescriptions New Prescriptions   OXYCODONE-ACETAMINOPHEN (PERCOCET/ROXICET) 5-325 MG TABLET    Take 1 tablet by mouth every 6 (six) hours as needed for severe pain.   TAMSULOSIN (FLOMAX) 0.4 MG CAPS CAPSULE    Take 1 capsule (0.4 mg total) by mouth daily.   2:19 PM This is an elderly male with history of carcinoid tumor status post small bowel resection here with abdominal pain that felt similar  to prior cancer pain. He does have a pretty tender abdomen on exam.  Will obtain abd/pelvis CT for further care.  Pt did not have an abd/pelvis CT scan from yesterday's visit from [facility] discussed with DR. MacKuen.   3:56 PM Abdominal and pelvis CT scan without any signs of cancer. There is an 8 mm stone in the left kidney near the proximal ureter with some inflammatory changes at the proximal ureter. This is where patient report having most pain. He did notice some darkened urine for the past few days. His urinalysis does not show any signs of infection or blood. Labs are otherwise reassuring. After receiving 4 mg of morphine patient states he is now pain-free. I encouraged patient to follow-up with a last urology for further management of his kidney stone which is likely the source of his pain. He will be discharged with pain medication, Flomax, and return precaution.   Domenic Moras, PA-C 11/01/16 1603    Courteney Julio Alm, MD 11/10/16 1949

## 2016-11-01 NOTE — ED Notes (Signed)
RN will collect labs at time of IV

## 2016-11-04 DIAGNOSIS — N200 Calculus of kidney: Secondary | ICD-10-CM | POA: Diagnosis not present

## 2016-11-04 DIAGNOSIS — Z6823 Body mass index (BMI) 23.0-23.9, adult: Secondary | ICD-10-CM | POA: Diagnosis not present

## 2017-01-04 DIAGNOSIS — Z87891 Personal history of nicotine dependence: Secondary | ICD-10-CM | POA: Diagnosis not present

## 2017-01-04 DIAGNOSIS — Z0000 Encounter for general adult medical examination without abnormal findings: Secondary | ICD-10-CM | POA: Diagnosis not present

## 2017-01-04 DIAGNOSIS — R42 Dizziness and giddiness: Secondary | ICD-10-CM | POA: Diagnosis not present

## 2017-01-04 DIAGNOSIS — Z6820 Body mass index (BMI) 20.0-20.9, adult: Secondary | ICD-10-CM | POA: Diagnosis not present

## 2017-01-04 DIAGNOSIS — Z9049 Acquired absence of other specified parts of digestive tract: Secondary | ICD-10-CM | POA: Diagnosis not present

## 2017-01-04 DIAGNOSIS — K909 Intestinal malabsorption, unspecified: Secondary | ICD-10-CM | POA: Diagnosis not present

## 2017-01-04 DIAGNOSIS — K228 Other specified diseases of esophagus: Secondary | ICD-10-CM | POA: Diagnosis not present

## 2017-01-04 DIAGNOSIS — Z972 Presence of dental prosthetic device (complete) (partial): Secondary | ICD-10-CM | POA: Diagnosis not present

## 2017-01-04 DIAGNOSIS — R197 Diarrhea, unspecified: Secondary | ICD-10-CM | POA: Diagnosis not present

## 2017-02-05 DIAGNOSIS — E340 Carcinoid syndrome: Secondary | ICD-10-CM | POA: Diagnosis not present

## 2017-02-05 DIAGNOSIS — N200 Calculus of kidney: Secondary | ICD-10-CM | POA: Diagnosis not present

## 2017-02-05 DIAGNOSIS — J449 Chronic obstructive pulmonary disease, unspecified: Secondary | ICD-10-CM | POA: Diagnosis not present

## 2017-02-14 ENCOUNTER — Ambulatory Visit (HOSPITAL_COMMUNITY)
Admission: RE | Admit: 2017-02-14 | Discharge: 2017-02-14 | Disposition: A | Discharge status: Home / Self Care | Payer: Medicare HMO | Source: Ambulatory Visit | Attending: Urology | Admitting: Urology

## 2017-02-14 ENCOUNTER — Other Ambulatory Visit: Payer: Self-pay | Admitting: Urology

## 2017-02-14 ENCOUNTER — Ambulatory Visit (INDEPENDENT_AMBULATORY_CARE_PROVIDER_SITE_OTHER): Payer: Medicare HMO | Admitting: Urology

## 2017-02-14 DIAGNOSIS — N200 Calculus of kidney: Secondary | ICD-10-CM | POA: Diagnosis not present

## 2017-02-25 ENCOUNTER — Encounter (HOSPITAL_COMMUNITY): Payer: Self-pay | Admitting: *Deleted

## 2017-02-25 ENCOUNTER — Other Ambulatory Visit: Payer: Self-pay | Admitting: Urology

## 2017-02-26 NOTE — H&P (Signed)
CC: [name] have kidney stones.  HPI: Peter [name] is a 71 year-old male patient who was referred by Dr. Iona Beard, MD who is here for renal calculi.  The problem is on the left side. He first stated noticing pain on approximately 10/25/2016. This is his first kidney stone. He is currently having flank pain. He denies having back pain, groin pain, nausea, vomiting, fever, and chills. He has not caught a stone in his urine strainer since his symptoms began.   He has never had surgical treatment for calculi in the past.   Peter [name] s 71 yo male who is sent in consultation by Dr. Iona [name] for an 52mm LLP stone with peripelvic cysts seen on CT in December during an ER visit at [facility] for left flank pain that was severe. He has had no hematuria but he has chronic frequency. He had peripelvic cysts on the left and mild ureteral dilation to the bladder but no ureteral stones on CT. He had the onset the first of this week of recurrent pain.      ALLERGIES: Eggs shell fish    MEDICATIONS: Tamsulosin Hcl  Carafate  Oxycodone-Acetaminophen 5 mg-325 mg tablet     GU PSH: None     PSH Notes: small intestine surgery   NON-GU PSH: Appendectomy (laparoscopic) Colonoscopy    GU PMH: None     PMH Notes: small intestine cancer   NON-GU PMH: None   FAMILY HISTORY: Diabetes - Runs in Family Lung Cancer - Runs in Family   SOCIAL HISTORY: Marital Status: Widowed Current Smoking Status: Patient does not smoke anymore.   Tobacco Use Assessment Completed: Used Tobacco in last 30 days? Has never drank.     REVIEW OF SYSTEMS:    GU Review Male:   Patient reports frequent urination and hard to postpone urination. Patient denies burning/ pain with urination, get up at night to urinate, leakage of urine, stream starts and stops, trouble starting your stream, have to strain to urinate , erection problems, and penile pain.  Gastrointestinal (Upper):   Patient denies nausea, vomiting, and indigestion/  heartburn.  Gastrointestinal (Lower):   Patient denies diarrhea and constipation.  Constitutional:   Patient denies fever, night sweats, weight loss, and fatigue.  Skin:   Patient denies skin rash/ lesion and itching.  Eyes:   Patient denies blurred vision and double vision.  Ears/ Nose/ Throat:   Patient denies sore throat and sinus problems.  Hematologic/Lymphatic:   Patient denies swollen glands and easy bruising.  Cardiovascular:   Patient denies leg swelling and chest pains.  Respiratory:   Patient denies cough and shortness of breath.  Endocrine:   Patient denies excessive thirst.  Musculoskeletal:   Patient denies back pain and joint pain.  Neurological:   Patient denies headaches and dizziness.  Psychologic:   Patient denies depression and anxiety.   VITAL SIGNS:      02/14/2017 02:03 PM  Weight 156 lb / 70.76 kg  Height 61 in / 154.94 cm  BP 129/76 mmHg  Pulse 80 /min  Temperature 97.6 F / 36 C  BMI 29.5 kg/m   MULTI-SYSTEM PHYSICAL EXAMINATION:    Constitutional: Well-nourished. No physical deformities. Normally developed. Good grooming.  Neck: Neck symmetrical, not swollen. Normal tracheal position.  Respiratory: No labored breathing, no use of accessory muscles. CTA  Cardiovascular: Normal temperature, RRR without murmur  Lymphatic: No enlargement, no tenderness of axillae, supraclavicular, neck lymph nodes.  Skin: No paleness, no jaundice, no  cyanosis. No lesion, no ulcer, no rash.  Neurologic / Psychiatric: Oriented to time, oriented to place, oriented to person. No depression, no anxiety, no agitation.  Gastrointestinal: No mass, no tenderness, no rigidity, non obese abdomen.  Musculoskeletal: Normal gait and station of head and neck.     PAST DATA REVIEWED:  Source Of History:  Patient  Lab Test Review:   CBC with Diff, BMP  Records Review:   Previous Doctor Records, Previous Hospital Records  Urine Test Review:   Urinalysis  X-Ray Review: C.T. Abdomen/Pelvis:  Reviewed Films. Reviewed Report. Discussed With Patient.    Notes:                     [name] have reviewed records from Dr. Berdine Addison and from the ER visit on 11/01/16. His labs were normal and his UA was clear. [place]    PROCEDURES:          Urinalysis - 81003 Dipstick Dipstick Cont'd  Specimen: Voided Bilirubin: Neg  Color: Yellow Ketones: Neg  Appearance: Clear Blood: Trace Intact  Specific Gravity: 1.015 Protein: Neg  pH: 5.0 Urobilinogen: 0.2  Glucose: Neg Nitrites: Neg    Leukocyte Esterase: Neg    ASSESSMENT:      ICD-10 Details  1 GU:   Kidney Stone - N20.0 Left, he has a LLP stone with intermitent pain. He have have intermittent obstruction. He does have a left peripelvic cyst. [name] am going to get a KUB and if the stone is readily visible [name] will get him set up for ESWL. [name] reviewed the risks of bleeding, infection, renal or adjacent organ injury, need for secondary procedures, failure to improve the pain, thrombotic events and sedation risks.    PLAN:            Medications New Meds: Oxycodone-Acetaminophen 5 mg-325 mg tablet 1 tablet PO Q 6 H PRN   #15  0 Refill(s)            Orders X-Rays: KUB          Schedule Return Visit/Planned Activity: Next Available Appointment - Schedule Surgery             Note: left renal ESWL

## 2017-02-27 ENCOUNTER — Ambulatory Visit (HOSPITAL_COMMUNITY): Payer: Medicare HMO

## 2017-02-27 ENCOUNTER — Encounter (HOSPITAL_COMMUNITY)
Admission: RE | Disposition: A | Discharge status: Home / Self Care | Payer: Self-pay | Source: Ambulatory Visit | Attending: Urology

## 2017-02-27 ENCOUNTER — Ambulatory Visit (HOSPITAL_COMMUNITY)
Admission: RE | Admit: 2017-02-27 | Discharge: 2017-02-27 | Disposition: A | Discharge status: Home / Self Care | Payer: Medicare HMO | Source: Ambulatory Visit | Attending: Urology | Admitting: Urology

## 2017-02-27 ENCOUNTER — Encounter (HOSPITAL_COMMUNITY): Payer: Self-pay | Admitting: *Deleted

## 2017-02-27 DIAGNOSIS — Z91013 Allergy to seafood: Secondary | ICD-10-CM | POA: Insufficient documentation

## 2017-02-27 DIAGNOSIS — Z79891 Long term (current) use of opiate analgesic: Secondary | ICD-10-CM | POA: Diagnosis not present

## 2017-02-27 DIAGNOSIS — Z85068 Personal history of other malignant neoplasm of small intestine: Secondary | ICD-10-CM | POA: Insufficient documentation

## 2017-02-27 DIAGNOSIS — Z9049 Acquired absence of other specified parts of digestive tract: Secondary | ICD-10-CM | POA: Diagnosis not present

## 2017-02-27 DIAGNOSIS — Z801 Family history of malignant neoplasm of trachea, bronchus and lung: Secondary | ICD-10-CM | POA: Diagnosis not present

## 2017-02-27 DIAGNOSIS — N200 Calculus of kidney: Secondary | ICD-10-CM | POA: Diagnosis not present

## 2017-02-27 DIAGNOSIS — Z833 Family history of diabetes mellitus: Secondary | ICD-10-CM | POA: Insufficient documentation

## 2017-02-27 DIAGNOSIS — N202 Calculus of kidney with calculus of ureter: Secondary | ICD-10-CM | POA: Diagnosis present

## 2017-02-27 DIAGNOSIS — Z91012 Allergy to eggs: Secondary | ICD-10-CM | POA: Insufficient documentation

## 2017-02-27 DIAGNOSIS — N281 Cyst of kidney, acquired: Secondary | ICD-10-CM | POA: Diagnosis not present

## 2017-02-27 DIAGNOSIS — Z87891 Personal history of nicotine dependence: Secondary | ICD-10-CM | POA: Diagnosis not present

## 2017-02-27 DIAGNOSIS — N201 Calculus of ureter: Secondary | ICD-10-CM

## 2017-02-27 DIAGNOSIS — Z79899 Other long term (current) drug therapy: Secondary | ICD-10-CM | POA: Diagnosis not present

## 2017-02-27 HISTORY — PX: EXTRACORPOREAL SHOCK WAVE LITHOTRIPSY: SHX1557

## 2017-02-27 HISTORY — DX: Personal history of urinary calculi: Z87.442

## 2017-02-27 SURGERY — LITHOTRIPSY, ESWL
Anesthesia: LOCAL | Laterality: Left

## 2017-02-27 MED ORDER — TAMSULOSIN HCL 0.4 MG PO CAPS: 0.4000 mg | ORAL_CAPSULE | Freq: Every day | ORAL | 0 refills | Status: AC

## 2017-02-27 MED ORDER — DIPHENHYDRAMINE HCL 25 MG PO CAPS
25.0000 mg | ORAL_CAPSULE | ORAL | Status: AC
  Administered 2017-02-27: 25 mg via ORAL
  Filled 2017-02-27: qty 1

## 2017-02-27 MED ORDER — SODIUM CHLORIDE 0.9 % IV SOLN
INTRAVENOUS | Status: DC
  Administered 2017-02-27: 10:00:00 via INTRAVENOUS

## 2017-02-27 MED ORDER — CIPROFLOXACIN HCL 500 MG PO TABS
500.0000 mg | ORAL_TABLET | ORAL | Status: AC
  Administered 2017-02-27: 500 mg via ORAL
  Filled 2017-02-27: qty 1

## 2017-02-27 MED ORDER — DIAZEPAM 5 MG PO TABS
10.0000 mg | ORAL_TABLET | ORAL | Status: AC
  Administered 2017-02-27: 10 mg via ORAL
  Filled 2017-02-27: qty 2

## 2017-02-27 NOTE — Interval H&P Note (Signed)
History and Physical Interval Note: No change in stone position.   02/27/2017 10:13 AM  Peter [name]  has presented today for surgery, with the diagnosis of LEFT URETERAL STONE  The various methods of treatment have been discussed with the patient and family. After consideration of risks, benefits and other options for treatment, the patient has consented to  Procedure(s): LEFT EXTRACORPOREAL SHOCK WAVE LITHOTRIPSY (ESWL) (Left) as a surgical intervention .  The patient's history has been reviewed, patient examined, no change in status, stable for surgery.  I have reviewed the patient's chart and labs.  Questions were answered to the patient's satisfaction.     [name] J

## 2017-02-27 NOTE — Discharge Instructions (Addendum)
Moderate Conscious Sedation, Adult, Care After °These instructions provide you with information about caring for yourself after your procedure. Your health care provider may also give you more specific instructions. Your treatment has been planned according to current medical practices, but problems sometimes occur. Call your health care provider if you have any problems or questions after your procedure. °What can I expect after the procedure? °After your procedure, it is common: °· To feel sleepy for several hours. °· To feel clumsy and have poor balance for several hours. °· To have poor judgment for several hours. °· To vomit if you eat too soon. °Follow these instructions at home: °For at least 24 hours after the procedure:  ° °· Do not: °¨ Participate in activities where you could fall or become injured. °¨ Drive. °¨ Use heavy machinery. °¨ Drink alcohol. °¨ Take sleeping pills or medicines that cause drowsiness. °¨ Make important decisions or sign legal documents. °¨ Take care of children on your own. °· Rest. °Eating and drinking  °· Follow the diet recommended by your health care provider. °· If you vomit: °¨ Drink water, juice, or soup when you can drink without vomiting. °¨ Make sure you have little or no nausea before eating solid foods. °General instructions  °· Have a responsible adult stay with you until you are awake and alert. °· Take over-the-counter and prescription medicines only as told by your health care provider. °· If you smoke, do not smoke without supervision. °· Keep all follow-up visits as told by your health care provider. This is important. °Contact a health care provider if: °· You keep feeling nauseous or you keep vomiting. °· You feel light-headed. °· You develop a rash. °· You have a fever. °Get help right away if: °· You have trouble breathing. °This information is not intended to replace advice given to you by your health care provider. Make sure you discuss any questions you  have with your health care provider. °Document Released: 09/01/2013 Document Revised: 04/15/2016 Document Reviewed: 03/02/2016 °Elsevier Interactive Patient Education © 2017 [employer] °Lithotripsy, Care After °This sheet gives you information about how to care for yourself after your procedure. Your health care provider may also give you more specific instructions. If you have problems or questions, contact your health care provider. °What can I expect after the procedure? °After the procedure, it is common to have: °· Some blood in your urine. This should only last for a few days. °· Soreness in your back, sides, or upper abdomen for a few days. °· Blotches or bruises on your back where the pressure wave entered the skin. °· Pain, discomfort, or nausea when pieces (fragments) of the kidney stone move through the tube that carries urine from the kidney to the bladder (ureter). Stone fragments may pass soon after the procedure, but they may continue to pass for up to 4-8 weeks. °¨ If you have severe pain or nausea, contact your health care provider. This may be caused by a large stone that was not broken up, and this may mean that you need more treatment. °· Some pain or discomfort during urination. °· Some pain or discomfort in the lower abdomen or (in men) at the base of the penis. °Follow these instructions at home: °Medicines  °· Take over-the-counter and prescription medicines only as told by your health care provider. °· If you were prescribed an antibiotic medicine, take it as told by your health care provider. Do not stop taking the antibiotic even   if you start to feel better.  Do not drive for 24 hours if you were given a medicine to help you relax (sedative).  Do not drive or use heavy machinery while taking prescription pain medicine. Eating and drinking   Drink enough water and fluids to keep your urine clear or pale yellow. This helps any remaining pieces of the stone to pass. It can also help  prevent new stones from forming.  Eat plenty of fresh fruits and vegetables.  Follow instructions from your health care provider about eating and drinking restrictions. You may be instructed:  To reduce how much salt (sodium) you eat or drink. Check ingredients and nutrition facts on packaged foods and beverages.  To reduce how much meat you eat.  Eat the recommended amount of calcium for your age and gender. Ask your health care provider how much calcium you should have. General instructions   Get plenty of rest.  Most people can resume normal activities 1-2 days after the procedure. Ask your health care provider what activities are safe for you.  If directed, strain all urine through the strainer that was provided by your health care provider.  Keep all fragments for your health care provider to see. Any stones that are found may be sent to a medical lab for examination. The stone may be as small as a grain of salt.  Keep all follow-up visits as told by your health care provider. This is important. Contact a health care provider if:  You have pain that is severe or does not get better with medicine.  You have nausea that is severe or does not go away.  You have blood in your urine longer than your health care provider told you to expect.  You have more blood in your urine.  You have pain during urination that does not go away.  You urinate more frequently than usual and this does not go away.  You develop a rash or any other possible signs of an allergic reaction. Get help right away if:  You have severe pain in your back, sides, or upper abdomen.  You have severe pain while urinating.  Your urine is very dark red.  You have blood in your stool (feces).  You cannot pass any urine at all.  You feel a strong urge to urinate after emptying your bladder.  You have a fever or chills.  You develop shortness of breath, difficulty breathing, or chest pain.  You have  severe nausea that leads to persistent vomiting.  You faint. Summary  After this procedure, it is common to have some pain, discomfort, or nausea when pieces (fragments) of the kidney stone move through the tube that carries urine from the kidney to the bladder (ureter). If this pain or nausea is severe, however, you should contact your health care provider.  Most people can resume normal activities 1-2 days after the procedure. Ask your health care provider what activities are safe for you.  Drink enough water and fluids to keep your urine clear or pale yellow. This helps any remaining pieces of the stone to pass, and it can help prevent new stones from forming.  If directed, strain your urine and keep all fragments for your health care provider to see. Fragments or stones may be as small as a grain of salt.  Get help right away if you have severe pain in your back, sides, or upper abdomen or have severe pain while urinating. This information is not  intended to replace advice given to you by your health care provider. Make sure you discuss any questions you have with your health care provider. Document Released: 12/01/2007 Document Revised: 10/02/2016 Document Reviewed: 10/02/2016 Elsevier Interactive Patient Education  2017 [employer].

## 2017-03-07 DIAGNOSIS — C7A095 Malignant carcinoid tumor of the midgut NOS: Secondary | ICD-10-CM | POA: Diagnosis not present

## 2017-03-07 DIAGNOSIS — Z9889 Other specified postprocedural states: Secondary | ICD-10-CM | POA: Diagnosis not present

## 2017-03-07 DIAGNOSIS — D3A00 Benign carcinoid tumor of unspecified site: Secondary | ICD-10-CM | POA: Diagnosis not present

## 2017-03-07 DIAGNOSIS — R197 Diarrhea, unspecified: Secondary | ICD-10-CM | POA: Diagnosis not present

## 2017-03-14 ENCOUNTER — Ambulatory Visit (INDEPENDENT_AMBULATORY_CARE_PROVIDER_SITE_OTHER): Payer: Self-pay | Admitting: Urology

## 2017-03-14 ENCOUNTER — Other Ambulatory Visit: Payer: Self-pay | Admitting: Urology

## 2017-03-14 ENCOUNTER — Ambulatory Visit (HOSPITAL_COMMUNITY)
Admission: RE | Admit: 2017-03-14 | Discharge: 2017-03-14 | Disposition: A | Discharge status: Home / Self Care | Payer: Medicare HMO | Source: Ambulatory Visit | Attending: Urology | Admitting: Urology

## 2017-03-14 DIAGNOSIS — N200 Calculus of kidney: Secondary | ICD-10-CM | POA: Diagnosis not present

## 2017-03-14 DIAGNOSIS — R109 Unspecified abdominal pain: Secondary | ICD-10-CM | POA: Diagnosis not present

## 2017-05-09 ENCOUNTER — Ambulatory Visit: Payer: Medicare HMO | Admitting: Urology

## 2017-05-12 DIAGNOSIS — E340 Carcinoid syndrome: Secondary | ICD-10-CM | POA: Diagnosis not present

## 2017-05-12 DIAGNOSIS — N200 Calculus of kidney: Secondary | ICD-10-CM | POA: Diagnosis not present

## 2017-05-12 DIAGNOSIS — J449 Chronic obstructive pulmonary disease, unspecified: Secondary | ICD-10-CM | POA: Diagnosis not present

## 2017-06-26 ENCOUNTER — Ambulatory Visit (HOSPITAL_COMMUNITY)
Admission: RE | Admit: 2017-06-26 | Discharge: 2017-06-26 | Disposition: A | Discharge status: Home / Self Care | Payer: Medicare HMO | Source: Ambulatory Visit | Attending: Urology | Admitting: Urology

## 2017-06-26 ENCOUNTER — Other Ambulatory Visit: Payer: Self-pay | Admitting: Urology

## 2017-06-26 DIAGNOSIS — N200 Calculus of kidney: Secondary | ICD-10-CM

## 2017-06-27 ENCOUNTER — Ambulatory Visit (INDEPENDENT_AMBULATORY_CARE_PROVIDER_SITE_OTHER): Payer: Medicare HMO | Admitting: Urology

## 2017-06-27 DIAGNOSIS — M545 Low back pain: Secondary | ICD-10-CM | POA: Diagnosis not present

## 2017-06-27 DIAGNOSIS — N200 Calculus of kidney: Secondary | ICD-10-CM

## 2017-07-23 IMAGING — CT CT ABD-PELV W/ CM
2 of 5 series · 16 of 46 positions shown, 18 images · IV contrast (ISOVUE)
Comparison: 10/26/2008

CLINICAL DATA: Left-sided abdominal pain for several days, initial
encounter

EXAM:
CT ABDOMEN AND PELVIS WITH CONTRAST
TECHNIQUE: Multidetector CT imaging of the abdomen and pelvis was performed
using the standard protocol following bolus administration of
intravenous contrast.
CONTRAST:  100mL [id number] IOPAMIDOL ([id number]-899) INJECTION 61%

[Series 2: abd/pel with · axial · 0.77mm/px · z∈[-496,-146]mm · 13 of 82 slices shown, 15 images]
[im 6/82  soft-tissue]
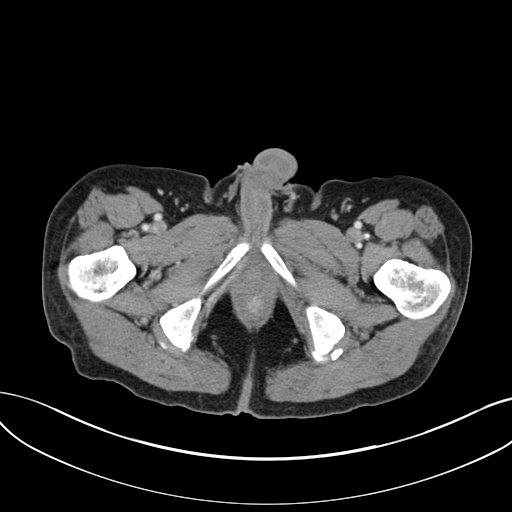
[im 6/82  bone]
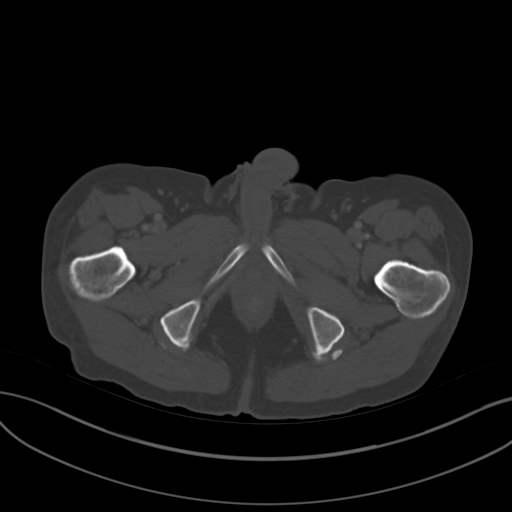
[im 11/82  soft-tissue]
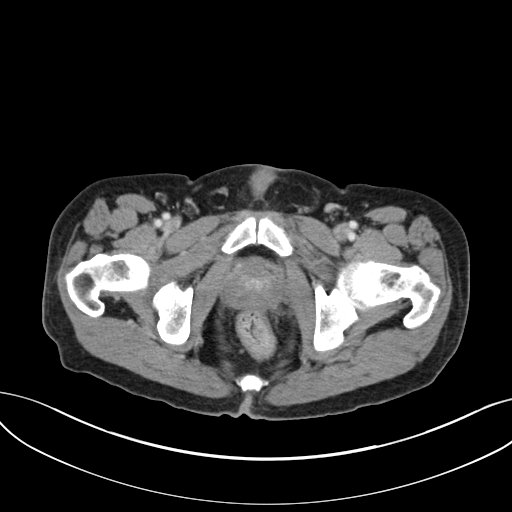
[im 16/82  soft-tissue]
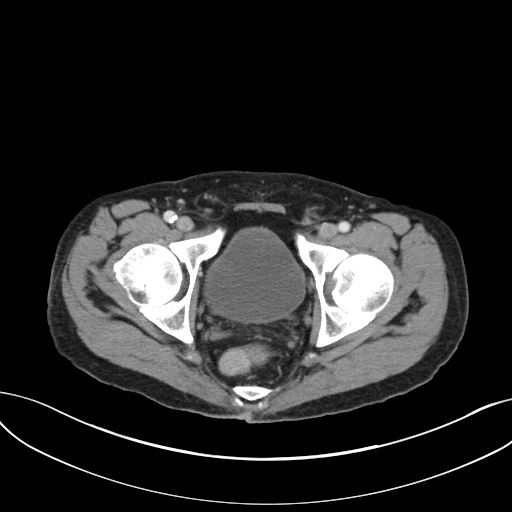
[im 26/82  soft-tissue]
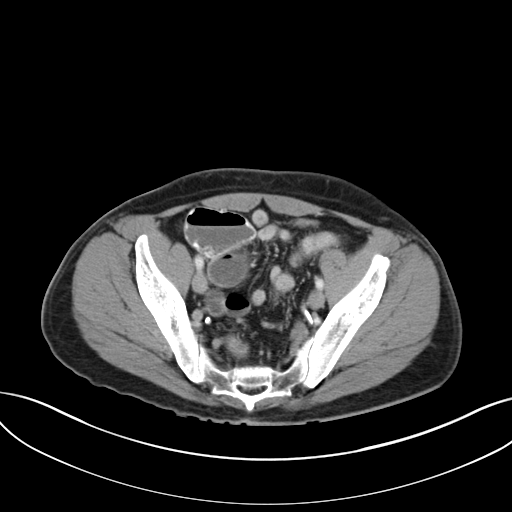
[im 31/82  soft-tissue]
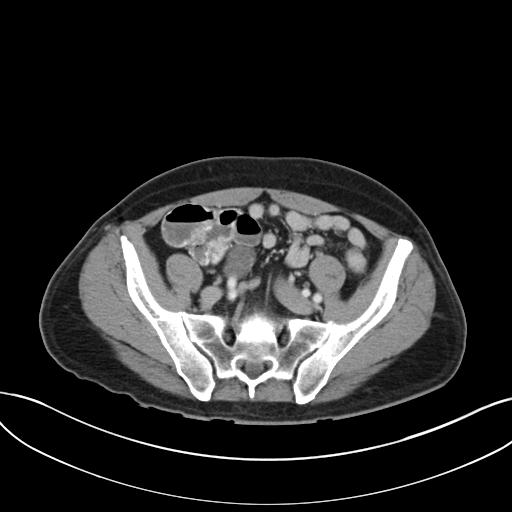
[im 36/82  soft-tissue]
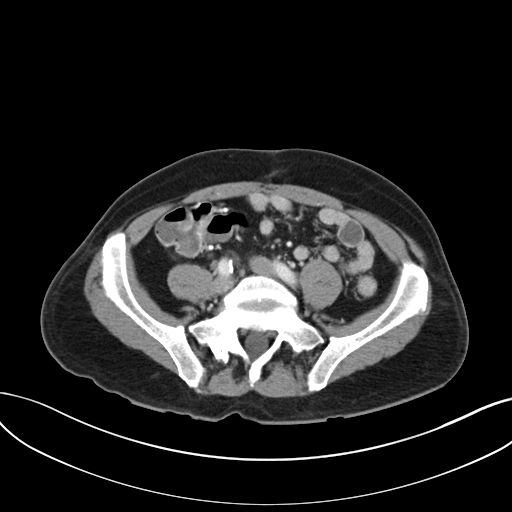
[im 41/82  soft-tissue]
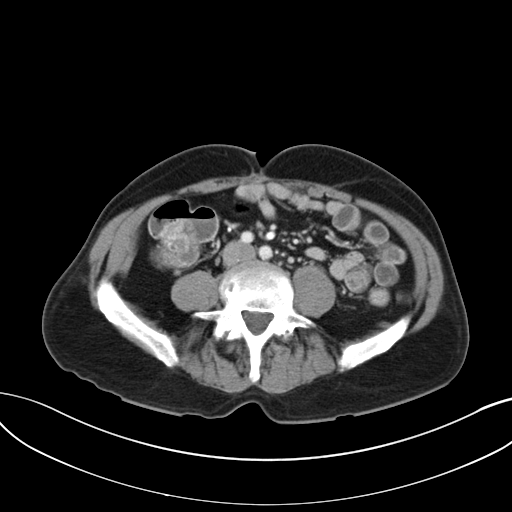
[im 46/82  soft-tissue]
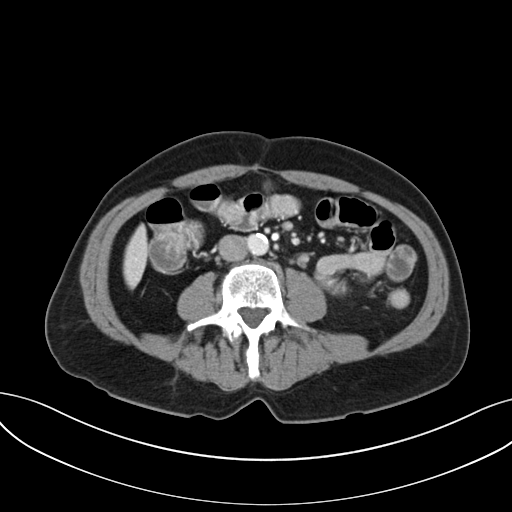
[im 51/82  soft-tissue]
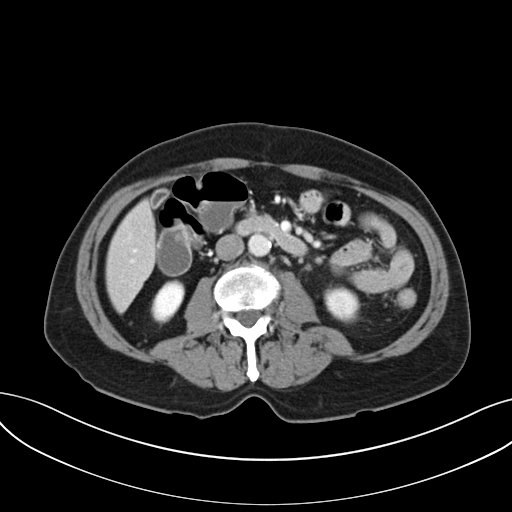
[im 51/82  bone]
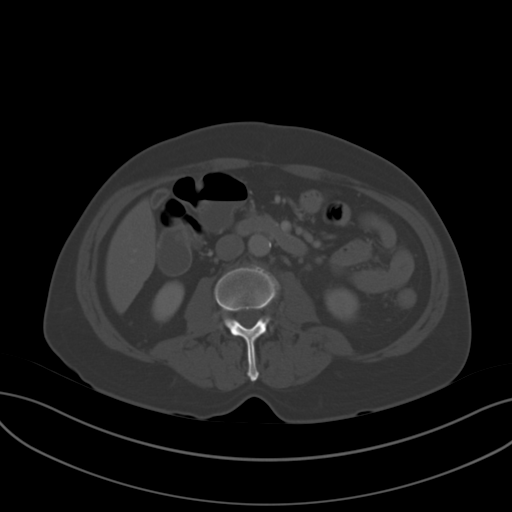
[im 56/82  soft-tissue]
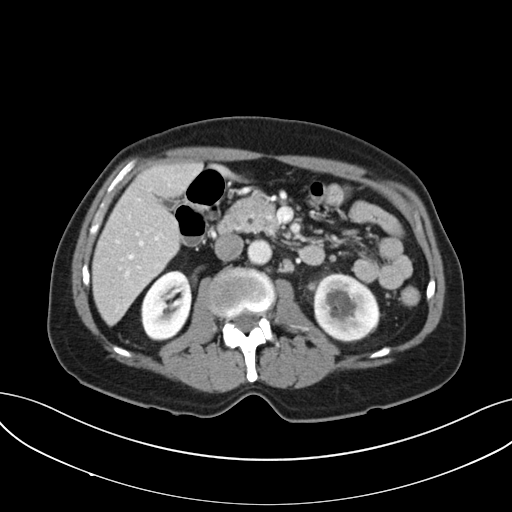
[im 66/82  soft-tissue]
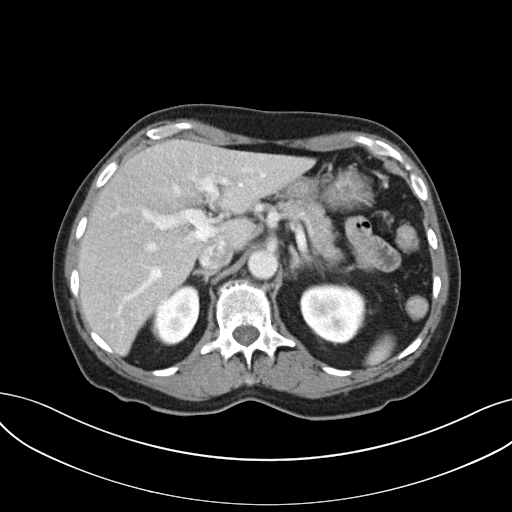
[im 71/82  soft-tissue]
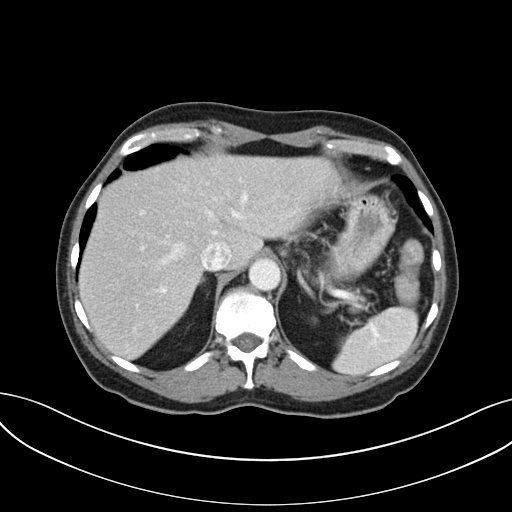
[im 76/82  soft-tissue]
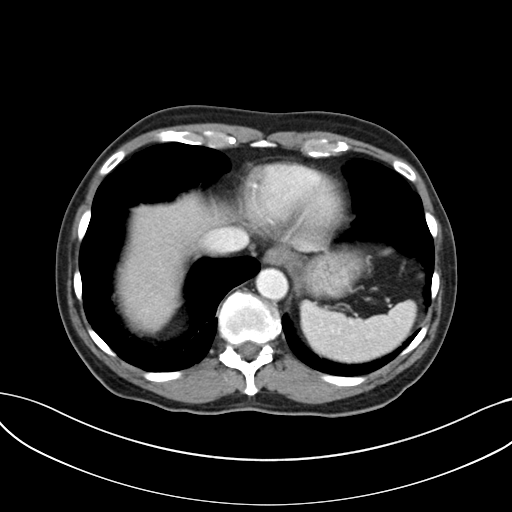

[Series 3: coronal a/|p · coronal · 0.75mm/px · 3 of 122 slices shown]
[im 41/122  soft-tissue]
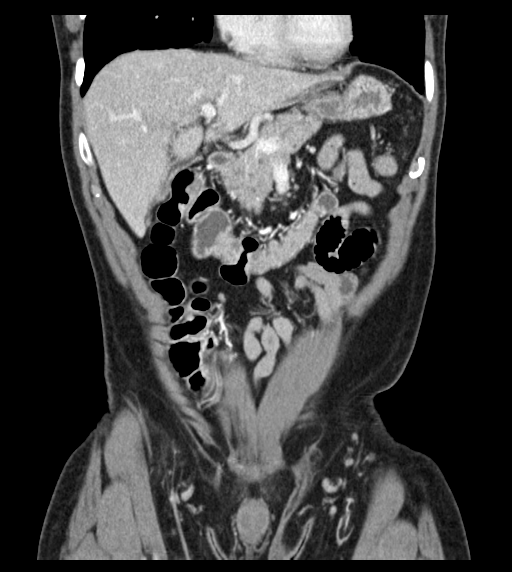
[im 54/122  soft-tissue]
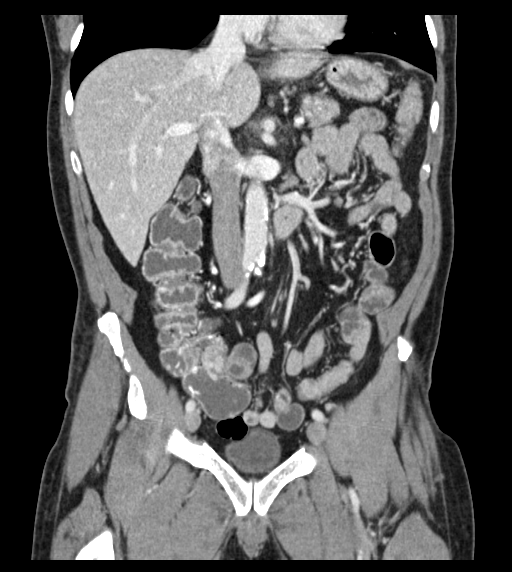
[im 68/122  soft-tissue]
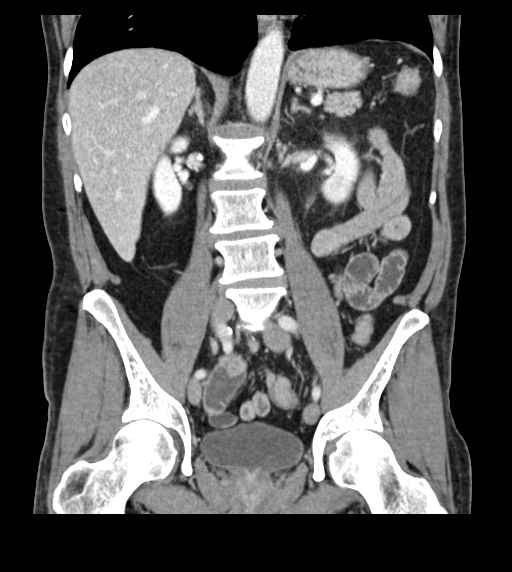

[16 of 46 positions shown; findings below may reference images not displayed]

FINDINGS: Lower chest: No acute abnormality.

Hepatobiliary: No focal liver abnormality is seen. No gallstones,
gallbladder wall thickening, or biliary dilatation.

Pancreas: Unremarkable. No pancreatic ductal dilatation or
surrounding inflammatory changes.

Spleen: Normal in size without focal abnormality.

Adrenals/Urinary Tract: The adrenal glands are within normal limits.
The right kidney is unremarkable. The left kidney demonstrates
evidence of parapelvic cysts as well as lower pole stone which
measures approximately 8 mm. Mild inflammatory changes are noted
surrounding the proximal ureter which may be root related to
transient obstructive change. Alternatively a smaller stone may have
passed recently. The bladder is within normal limits.

Stomach/Bowel: The appendix has been surgically removed. No
obstructive changes are noted. No inflammatory changes are seen.

Vascular/Lymphatic: Aortic atherosclerosis. No enlarged abdominal or
pelvic lymph nodes.

Reproductive: Prostate is unremarkable.

Other: No abdominal wall hernia or abnormality. No abdominopelvic
ascites.

Musculoskeletal: Degenerative changes of the lumbar spine are noted.
IMPRESSION: 8 mm stone in the lower pole of the left kidney. Some mild
inflammatory changes are noted around the collecting system and
proximal left ureter. There may be a transient degree of obstruction
secondary to the 8 mm stone moving in and out of the renal pelvis.
Alternatively these changes could be related to recently passed
stone.

No other focal abnormality is seen.

## 2017-09-26 ENCOUNTER — Other Ambulatory Visit: Payer: Self-pay | Admitting: Urology

## 2017-09-26 ENCOUNTER — Ambulatory Visit (HOSPITAL_COMMUNITY)
Admission: RE | Admit: 2017-09-26 | Discharge: 2017-09-26 | Disposition: A | Discharge status: Home / Self Care | Payer: Medicare HMO | Source: Ambulatory Visit | Attending: Urology | Admitting: Urology

## 2017-09-26 ENCOUNTER — Ambulatory Visit (INDEPENDENT_AMBULATORY_CARE_PROVIDER_SITE_OTHER): Payer: Medicare HMO | Admitting: Urology

## 2017-09-26 DIAGNOSIS — Z09 Encounter for follow-up examination after completed treatment for conditions other than malignant neoplasm: Secondary | ICD-10-CM | POA: Insufficient documentation

## 2017-09-26 DIAGNOSIS — N200 Calculus of kidney: Secondary | ICD-10-CM

## 2017-09-26 DIAGNOSIS — M545 Low back pain: Secondary | ICD-10-CM | POA: Diagnosis not present

## 2017-09-26 DIAGNOSIS — Z87442 Personal history of urinary calculi: Secondary | ICD-10-CM | POA: Diagnosis not present

## 2017-12-03 IMAGING — DX DG ABDOMEN 1V
1 series · 1 of 1 positions shown · non-contrast
Comparison: 02/27/2017

CLINICAL DATA: Pain left side/hx kidney stones/recent litho done 2
weeks ago/hx small intestine cancer 1828

EXAM:
ABDOMEN - 1 VIEW

[abdomen kub]
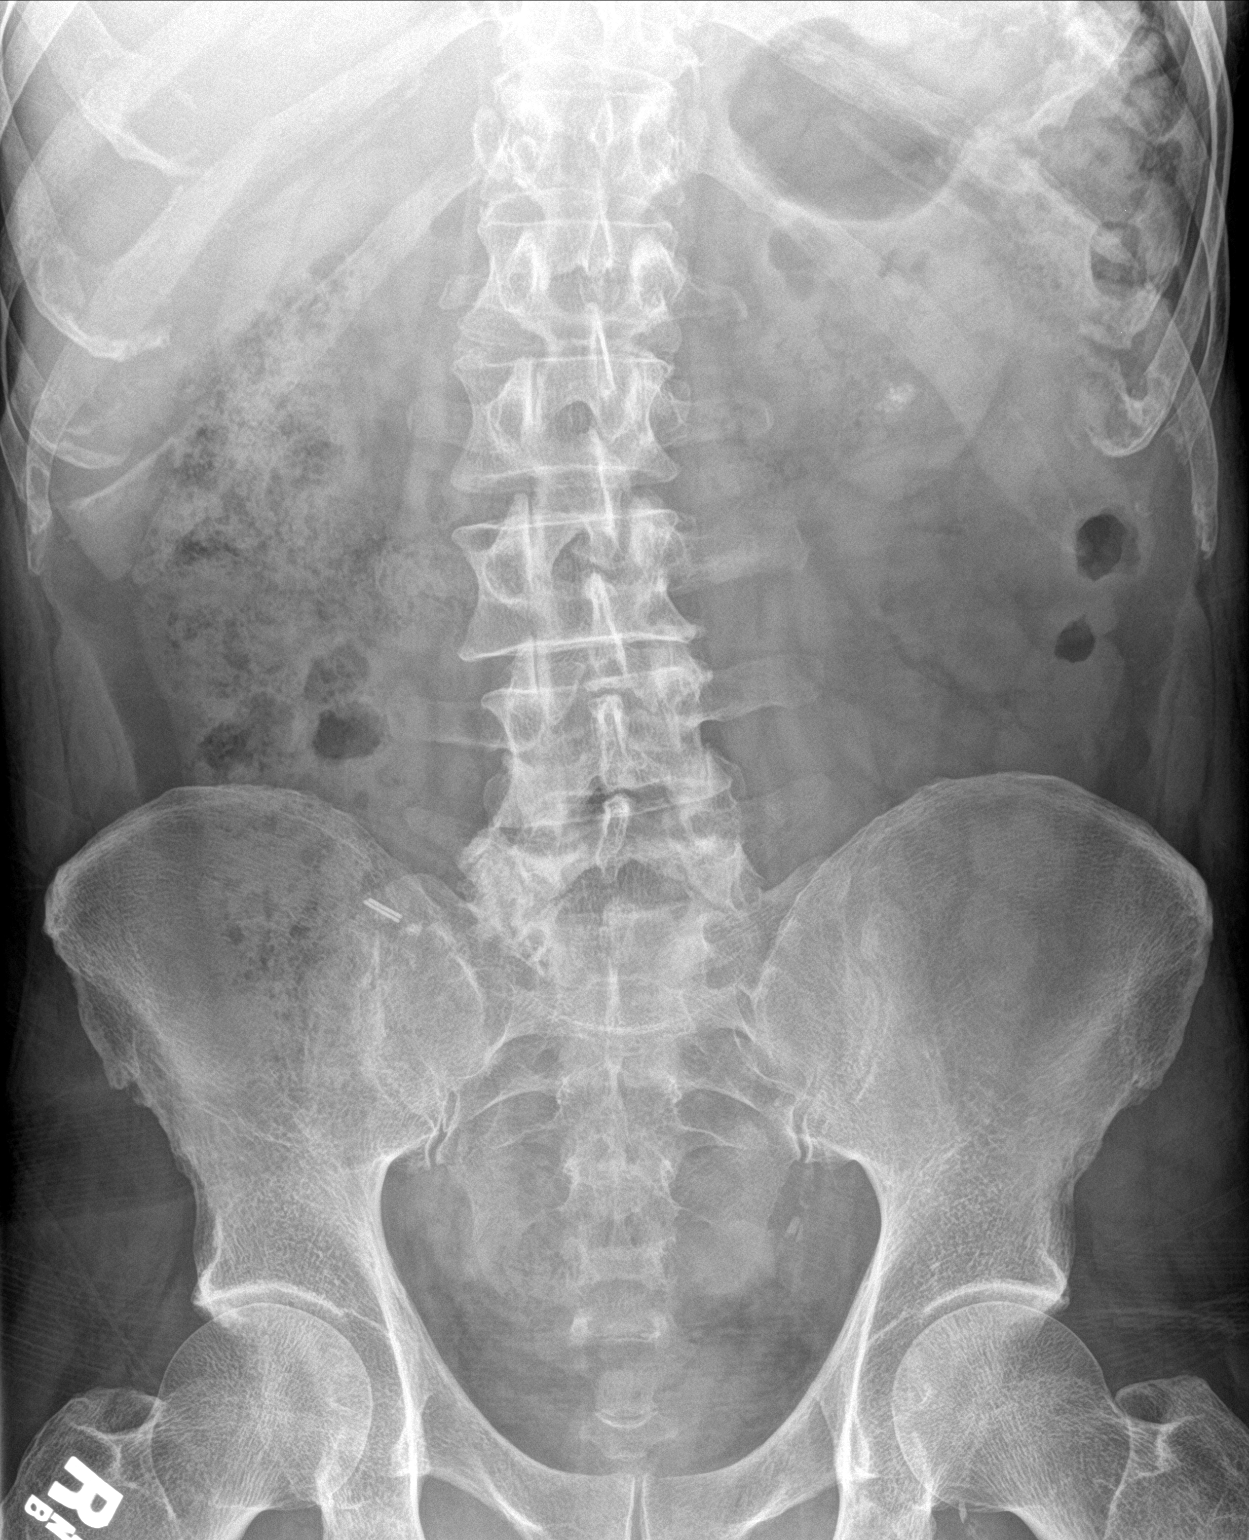

[1 of 1 positions shown; findings below may reference images not displayed]

FINDINGS: There analysis multiple small stones projecting within the lower
pole the left kidney, where there was a more discrete single stone.
This is consistent with fragmentation subsequent to lithotripsy.
Next item no other intrarenal stones. No evidence of a ureteral
stone.

Normal bowel gas pattern. Stable vascular clip in the right lower
quadrant.
IMPRESSION: 1. Multiple stone fragments are now noted in the lower pole the left
kidney reflecting disruption of the previously seen 7 mm stone. No
evidence of a ureteral stone.

## 2017-12-23 DIAGNOSIS — R69 Illness, unspecified: Secondary | ICD-10-CM | POA: Diagnosis not present

## 2018-03-17 IMAGING — DX DG ABDOMEN 1V
1 series · 1 of 1 positions shown · non-contrast
Comparison: 03/14/2017

CLINICAL DATA: LEFT SIDED KIDNEY STONE

EXAM:
ABDOMEN - 1 VIEW

[abdomen kub]
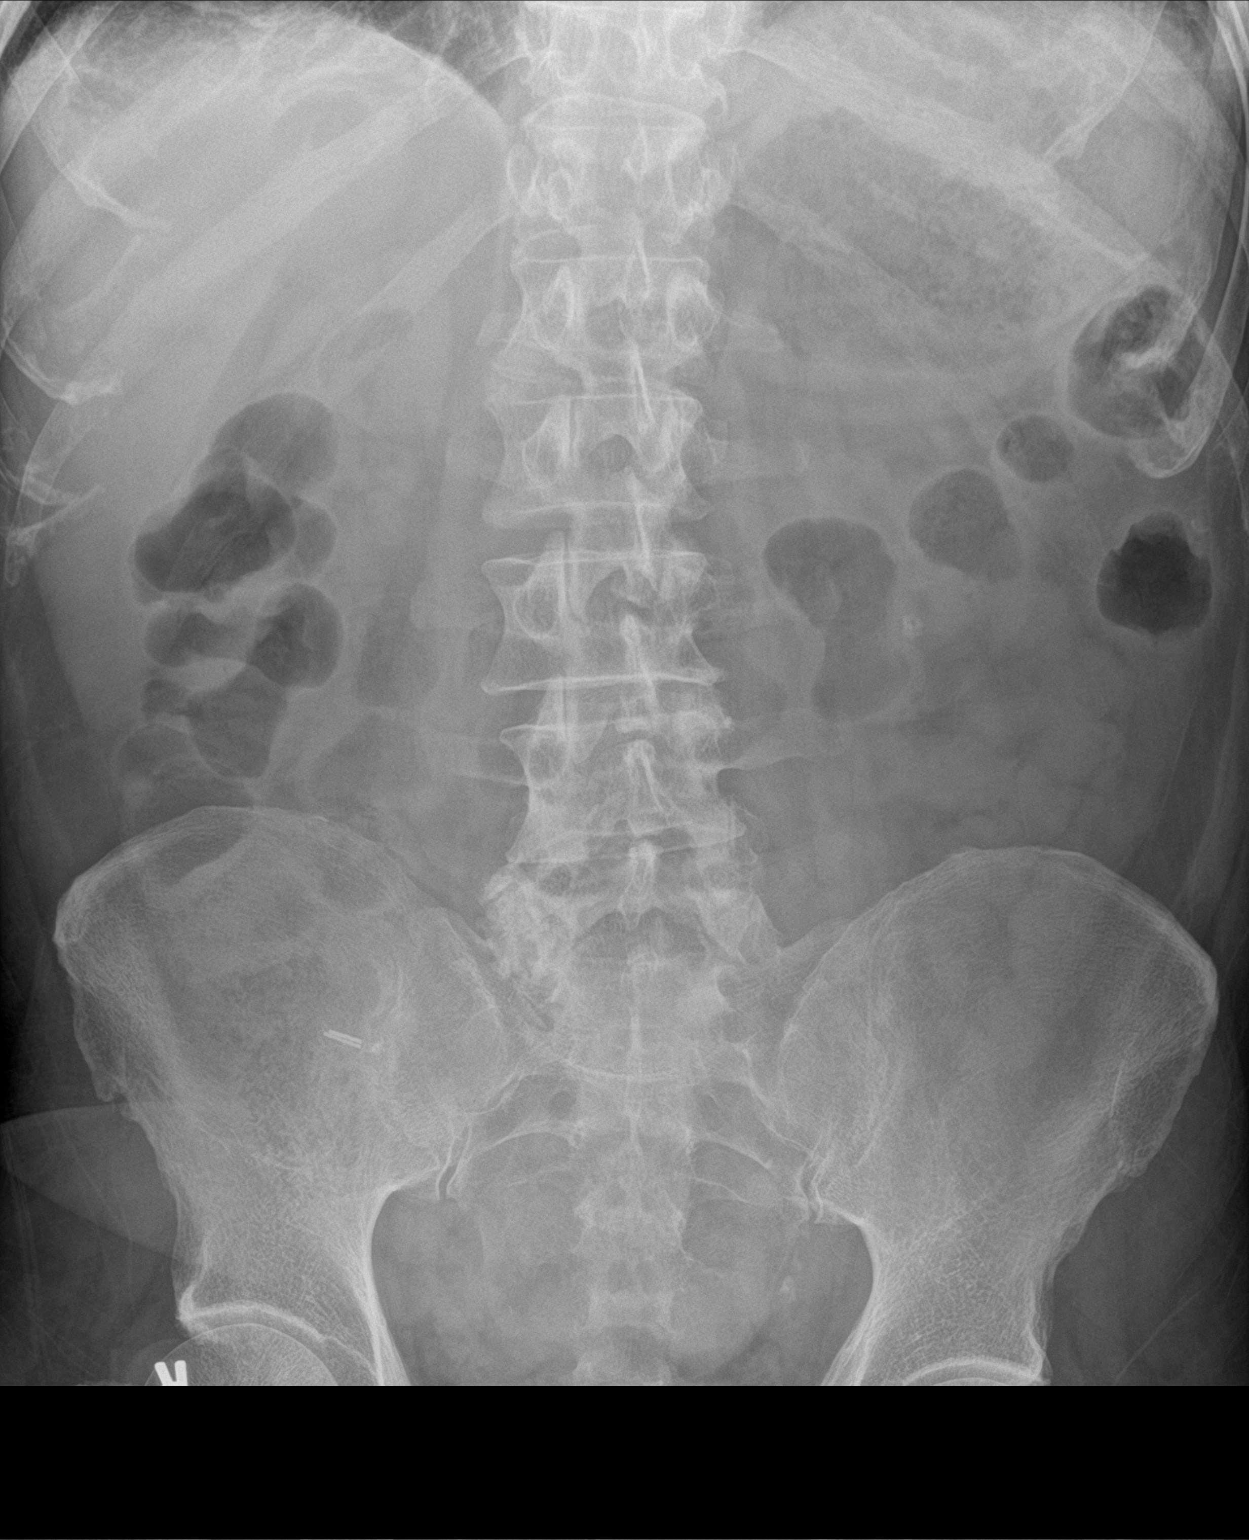

[1 of 1 positions shown; findings below may reference images not displayed]

FINDINGS: 6 mm stone or cluster projects over the lower pole left renal
collecting system.

Normal bowel gas pattern. Surgical clip in the right lower quadrant
as before.

Facet DJD in the lower lumbar spine.
IMPRESSION: 1. Left nephrolithiasis
2. Normal bowel gas pattern.

## 2018-04-06 ENCOUNTER — Ambulatory Visit
Admission: RE | Admit: 2018-04-06 | Discharge: 2018-04-06 | Disposition: A | Discharge status: Home / Self Care | Payer: Medicare HMO | Source: Ambulatory Visit | Attending: Family Medicine | Admitting: Family Medicine

## 2018-04-06 ENCOUNTER — Other Ambulatory Visit: Payer: Self-pay | Admitting: Family Medicine

## 2018-04-06 DIAGNOSIS — R059 Cough, unspecified: Secondary | ICD-10-CM

## 2018-04-06 DIAGNOSIS — R05 Cough: Secondary | ICD-10-CM | POA: Diagnosis not present

## 2018-04-06 DIAGNOSIS — J449 Chronic obstructive pulmonary disease, unspecified: Secondary | ICD-10-CM | POA: Diagnosis not present

## 2018-04-06 DIAGNOSIS — N200 Calculus of kidney: Secondary | ICD-10-CM | POA: Diagnosis not present

## 2018-04-06 DIAGNOSIS — E340 Carcinoid syndrome: Secondary | ICD-10-CM | POA: Diagnosis not present

## 2018-05-06 DIAGNOSIS — J189 Pneumonia, unspecified organism: Secondary | ICD-10-CM | POA: Diagnosis not present

## 2018-05-06 DIAGNOSIS — J441 Chronic obstructive pulmonary disease with (acute) exacerbation: Secondary | ICD-10-CM | POA: Diagnosis not present

## 2018-05-18 DIAGNOSIS — J449 Chronic obstructive pulmonary disease, unspecified: Secondary | ICD-10-CM | POA: Diagnosis not present

## 2018-05-18 DIAGNOSIS — J189 Pneumonia, unspecified organism: Secondary | ICD-10-CM | POA: Diagnosis not present

## 2018-09-03 DIAGNOSIS — H5200 Hypermetropia, unspecified eye: Secondary | ICD-10-CM | POA: Diagnosis not present

## 2018-09-03 DIAGNOSIS — H25813 Combined forms of age-related cataract, bilateral: Secondary | ICD-10-CM | POA: Diagnosis not present

## 2018-09-03 DIAGNOSIS — Z0100 Encounter for examination of eyes and vision without abnormal findings: Secondary | ICD-10-CM | POA: Diagnosis not present

## 2018-10-12 DIAGNOSIS — J069 Acute upper respiratory infection, unspecified: Secondary | ICD-10-CM | POA: Diagnosis not present

## 2018-10-12 DIAGNOSIS — Z6822 Body mass index (BMI) 22.0-22.9, adult: Secondary | ICD-10-CM | POA: Diagnosis not present

## 2018-12-06 ENCOUNTER — Emergency Department (HOSPITAL_COMMUNITY)
Admission: EM | Admit: 2018-12-06 | Discharge: 2018-12-06 | Disposition: A | Discharge status: Home / Self Care | Payer: Medicare HMO | Attending: Emergency Medicine | Admitting: Emergency Medicine

## 2018-12-06 ENCOUNTER — Encounter (HOSPITAL_COMMUNITY): Payer: Self-pay

## 2018-12-06 ENCOUNTER — Other Ambulatory Visit: Payer: Self-pay

## 2018-12-06 ENCOUNTER — Emergency Department (HOSPITAL_COMMUNITY): Payer: Medicare HMO

## 2018-12-06 DIAGNOSIS — R519 Headache, unspecified: Secondary | ICD-10-CM

## 2018-12-06 DIAGNOSIS — Z85068 Personal history of other malignant neoplasm of small intestine: Secondary | ICD-10-CM | POA: Diagnosis not present

## 2018-12-06 DIAGNOSIS — J449 Chronic obstructive pulmonary disease, unspecified: Secondary | ICD-10-CM | POA: Diagnosis not present

## 2018-12-06 DIAGNOSIS — Z87891 Personal history of nicotine dependence: Secondary | ICD-10-CM | POA: Insufficient documentation

## 2018-12-06 DIAGNOSIS — R51 Headache: Secondary | ICD-10-CM | POA: Diagnosis not present

## 2018-12-06 MED ORDER — KETOROLAC TROMETHAMINE 30 MG/ML IJ SOLN
30.0000 mg | Freq: Once | INTRAMUSCULAR | Status: DC
  Filled 2018-12-06: qty 1

## 2018-12-06 MED ORDER — DIPHENHYDRAMINE HCL 25 MG PO CAPS
25.0000 mg | ORAL_CAPSULE | Freq: Once | ORAL | Status: AC
  Administered 2018-12-06: 25 mg via ORAL
  Filled 2018-12-06: qty 1

## 2018-12-06 MED ORDER — METOCLOPRAMIDE HCL 5 MG/ML IJ SOLN
10.0000 mg | Freq: Once | INTRAMUSCULAR | Status: DC
  Filled 2018-12-06: qty 2

## 2018-12-06 MED ORDER — SODIUM CHLORIDE 0.9 % IV BOLUS: 500.0000 mL | Freq: Once | INTRAVENOUS | Status: DC

## 2018-12-06 MED ORDER — KETOROLAC TROMETHAMINE 60 MG/2ML IM SOLN
60.0000 mg | Freq: Once | INTRAMUSCULAR | Status: AC
  Administered 2018-12-06: 60 mg via INTRAMUSCULAR
  Filled 2018-12-06: qty 2

## 2018-12-06 MED ORDER — DIPHENHYDRAMINE HCL 50 MG/ML IJ SOLN
12.5000 mg | Freq: Once | INTRAMUSCULAR | Status: DC
  Filled 2018-12-06: qty 1

## 2018-12-06 MED ORDER — METOCLOPRAMIDE HCL 10 MG PO TABS
10.0000 mg | ORAL_TABLET | Freq: Once | ORAL | Status: AC
  Administered 2018-12-06: 10 mg via ORAL
  Filled 2018-12-06: qty 1

## 2018-12-06 NOTE — ED Triage Notes (Signed)
Pt states that he has had a headache x 1 week. Pt states that his headaches usually resolve with B.C. but has not this time.

## 2018-12-06 NOTE — Discharge Instructions (Addendum)
CT scan shows no acute findings.  Alternate Tylenol and ibuprofen for pain.  Follow-up with your primary care doctor

## 2018-12-06 NOTE — ED Notes (Signed)
ED Provider at bedside. 

## 2018-12-06 NOTE — ED Notes (Signed)
Pt stuck twice for IV access, both unsuccessful. Pt requesting IM medications instead. Made Dr Lacinda Axon aware. Awaiting new orders.

## 2018-12-06 NOTE — ED Notes (Signed)
Pt and visitor verbalized discharge instructions and follow up care. Alert and ambulatory. Visitor will be taking pt him home.

## 2018-12-06 NOTE — ED Provider Notes (Signed)
Peter [name], Peter [name] deficits, confusion, facial asymmetry, speech disorder, visual changes.  He has tried [facility] powders and Tylenol with minimal success.  Severity is mild to moderate.     Past Medical History:  Diagnosis Date  . Cancer ([place]) 2010   small intestine  . Food poisoning   . History of kidney stones     Patient Active Problem List   Diagnosis Date Noted  . Heme + stool 01/30/2015  . Cellulitis 03/31/2014  . Cellulitis of knee, left 03/31/2014  . [place] ALLERGY 05/24/2009  . HIP PAIN, RIGHT 02/27/2009  . 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 WEIGHT LOSS, RECENT 01/16/2009  . Diarrhea 01/16/2009  . SHOULDER PAIN, RIGHT 12/12/2008  . MALNUTRITION 02/12/2008  . OTHER ANXIETY STATES 02/12/2008  . ERECTILE DYSFUNCTION 01/05/2007  . HYPERLIPIDEMIA 11/29/2006  . COPD 11/29/2006  . ARTHRITIS 11/29/2006  . LOW BACK PAIN 11/29/2006    Past Surgical History:  Procedure Laterality Date  . APPENDECTOMY    . COLONOSCOPY N/A 02/15/2015   Procedure: COLONOSCOPY;  Surgeon: Daneil Dolin, MD;  Location: [facility] SUITE;  Service: Endoscopy;  Laterality: N/A;  1130 - moved to 11:00 - [place] to notify pt  . EXTRACORPOREAL SHOCK WAVE LITHOTRIPSY Left 02/27/2017   Procedure: LEFT EXTRACORPOREAL SHOCK WAVE LITHOTRIPSY (ESWL);  Surgeon: Irine Seal, MD;  Location: [facility] ORS;  Service: Urology;  Laterality: Left;  . SMALL INTESTINE SURGERY  2010   removed due to cancer        Home Medications    Prior to Admission medications   Medication Sig Start Date End Date Taking?  Authorizing Provider  Aspirin-Salicylamide-Caffeine (BC HEADACHE POWDER PO) Take 1 Package by mouth daily as needed (headache).   Yes [provider]  loperamide (IMODIUM A-D) 2 MG tablet Take 2 mg by mouth 4 (four) times daily as needed for diarrhea or loose stools.   Yes [provider]  oxyCODONE-acetaminophen (PERCOCET/ROXICET) 5-325 MG tablet Take 1 tablet by mouth every 6 (six) hours as needed for severe pain. Patient Peter taking: Reported on 12/06/2018 11/01/16   Domenic Moras, PA-C  tamsulosin (FLOMAX) 0.4 MG CAPS capsule Take 1 capsule (0.4 mg total) by mouth daily. Patient Peter taking: Reported on 12/06/2018 02/27/17   Irine Seal, MD    Family History Family History  Problem Relation Age of Onset  . Diabetes Other   . Lung cancer Brother   . Colon cancer Neg Hx     Social History Social History   Tobacco Use  . Smoking status: Former Smoker    Last attempt to quit: 11/25/1989    Years since quitting: 29.0  . Smokeless tobacco: Never Used  Substance Use Topics  . Alcohol use: Yes    Alcohol/[name]: 0.0 standard drinks    Comment: rarely/occasional  . Drug use: No     Allergies   Eggs or egg-derived products; Shellfish allergy; and Contrast media  [iodinated diagnostic agents]   Review of Systems Review of Systems  All other systems reviewed and are negative.    Physical Exam Updated Vital  Signs BP (!) 149/72 (BP Location: Right Arm)   Pulse 60   Temp 97.7 F (36.5 C) (Oral)   Resp 17   Ht 5\' 11"  (1.803 m)   Wt 70.3 kg   SpO2 99%   BMI 21.62 kg/m   Physical Exam Vitals signs and nursing note reviewed.  Constitutional:      Appearance: He is well-developed.  HENT:     Head: Normocephalic and atraumatic.  Eyes:     Conjunctiva/sclera: Conjunctivae normal.  Neck:     Musculoskeletal: Neck supple.  Cardiovascular:     Rate and Rhythm: Normal rate and regular rhythm.  Pulmonary:     Effort: Pulmonary effort is normal.     Breath sounds:  Normal breath sounds.  Abdominal:     General: Bowel sounds are normal.     Palpations: Abdomen is soft.  Musculoskeletal: Normal range of motion.  Skin:    General: Skin is warm and dry.  [name]:     Mental Status: He is alert and oriented to person, place, and time.     Comments: Normal neuro, sensory, cerebellar exam  Psychiatric:        Behavior: Behavior normal.      ED Treatments / Results  Labs (all labs ordered are listed, but only abnormal results are displayed) Labs Reviewed - No data to display  EKG None  Radiology Ct Head Wo Contrast  Result Date: 12/06/2018 CLINICAL DATA:  Headache for 1 [name]. EXAM: CT HEAD WITHOUT CONTRAST TECHNIQUE: Contiguous axial images were obtained from the base of the skull through the vertex without intravenous contrast. COMPARISON:  None. FINDINGS: Brain: There is no evidence for acute hemorrhage, hydrocephalus, mass lesion, or abnormal extra-axial fluid collection. No definite CT evidence for acute infarction. Vascular: No hyperdense vessel or unexpected calcification. Skull: No evidence for fracture. No worrisome lytic or sclerotic lesion. Sinuses/Orbits: The visualized paranasal sinuses and mastoid air cells are clear. Visualized portions of the globes and intraorbital fat are unremarkable. Other: None. IMPRESSION: Unremarkable study.  No acute intracranial abnormality. Electronically Signed   By: Misty Stanley M.D.   On: 12/06/2018 19:09    Procedures Procedures (including critical care time)  Medications Ordered in ED Medications  ketorolac (TORADOL) injection 60 mg (60 mg Intramuscular Given 12/06/18 1821)  diphenhydrAMINE (BENADRYL) capsule 25 mg (25 mg Oral Given 12/06/18 1817)  metoCLOPramide (REGLAN) tablet 10 mg (10 mg Oral Given 12/06/18 1817)     Initial Impression / Assessment and Plan / ED Course  I have reviewed the triage vital signs and the nursing notes.  Pertinent labs & imaging results that were available  during my care of the patient were reviewed by me and considered in my medical decision making (see chart for details).     Uncertain etiology of patient's prolonged headache.  He has no gross [name] deficits.  CT scan of head was negative.  Toradol 60 mg IM, oral Benadryl, oral Reglan given which helped his headache.  Patient will be discharged home.  Final Clinical Impressions(s) / ED Diagnoses   Final diagnoses:  Intractable headache, unspecified chronicity pattern, unspecified headache type    ED Discharge Orders    None       Nat Christen, MD 12/06/18 2131

## 2019-02-15 DIAGNOSIS — J449 Chronic obstructive pulmonary disease, unspecified: Secondary | ICD-10-CM | POA: Diagnosis not present

## 2019-02-15 DIAGNOSIS — J069 Acute upper respiratory infection, unspecified: Secondary | ICD-10-CM | POA: Diagnosis not present

## 2019-02-15 DIAGNOSIS — R634 Abnormal weight loss: Secondary | ICD-10-CM | POA: Diagnosis not present

## 2019-02-15 DIAGNOSIS — E705 Disorders of tryptophan metabolism: Secondary | ICD-10-CM | POA: Diagnosis not present

## 2019-02-15 DIAGNOSIS — E340 Carcinoid syndrome: Secondary | ICD-10-CM | POA: Diagnosis not present

## 2019-02-15 DIAGNOSIS — N200 Calculus of kidney: Secondary | ICD-10-CM | POA: Diagnosis not present

## 2019-02-15 DIAGNOSIS — R197 Diarrhea, unspecified: Secondary | ICD-10-CM | POA: Diagnosis not present

## 2019-02-15 DIAGNOSIS — H8309 Labyrinthitis, unspecified ear: Secondary | ICD-10-CM | POA: Diagnosis not present

## 2019-05-17 DIAGNOSIS — J449 Chronic obstructive pulmonary disease, unspecified: Secondary | ICD-10-CM | POA: Diagnosis not present

## 2019-05-17 DIAGNOSIS — E340 Carcinoid syndrome: Secondary | ICD-10-CM | POA: Diagnosis not present

## 2019-08-17 DIAGNOSIS — J441 Chronic obstructive pulmonary disease with (acute) exacerbation: Secondary | ICD-10-CM | POA: Diagnosis not present

## 2019-08-17 DIAGNOSIS — Z7189 Other specified counseling: Secondary | ICD-10-CM | POA: Diagnosis not present

## 2019-08-17 DIAGNOSIS — E340 Carcinoid syndrome: Secondary | ICD-10-CM | POA: Diagnosis not present

## 2019-09-01 DIAGNOSIS — H8309 Labyrinthitis, unspecified ear: Secondary | ICD-10-CM | POA: Diagnosis not present

## 2019-11-07 DIAGNOSIS — Z833 Family history of diabetes mellitus: Secondary | ICD-10-CM | POA: Diagnosis not present

## 2019-11-07 DIAGNOSIS — Z91041 Radiographic dye allergy status: Secondary | ICD-10-CM | POA: Diagnosis not present

## 2019-11-07 DIAGNOSIS — Z87891 Personal history of nicotine dependence: Secondary | ICD-10-CM | POA: Diagnosis not present

## 2019-11-07 DIAGNOSIS — Z91013 Allergy to seafood: Secondary | ICD-10-CM | POA: Diagnosis not present

## 2019-11-07 DIAGNOSIS — H547 Unspecified visual loss: Secondary | ICD-10-CM | POA: Diagnosis not present

## 2019-11-07 DIAGNOSIS — Z85038 Personal history of other malignant neoplasm of large intestine: Secondary | ICD-10-CM | POA: Diagnosis not present

## 2019-11-16 DIAGNOSIS — J449 Chronic obstructive pulmonary disease, unspecified: Secondary | ICD-10-CM | POA: Diagnosis not present

## 2019-11-16 DIAGNOSIS — Z7189 Other specified counseling: Secondary | ICD-10-CM | POA: Diagnosis not present

## 2019-11-16 DIAGNOSIS — E340 Carcinoid syndrome: Secondary | ICD-10-CM | POA: Diagnosis not present

## 2019-11-16 DIAGNOSIS — E785 Hyperlipidemia, unspecified: Secondary | ICD-10-CM | POA: Diagnosis not present

## 2019-11-29 DIAGNOSIS — M542 Cervicalgia: Secondary | ICD-10-CM | POA: Diagnosis not present

## 2019-12-15 DIAGNOSIS — Z85060 Personal history of malignant carcinoid tumor of small intestine: Secondary | ICD-10-CM | POA: Diagnosis not present

## 2019-12-15 DIAGNOSIS — Z833 Family history of diabetes mellitus: Secondary | ICD-10-CM | POA: Diagnosis not present

## 2019-12-15 DIAGNOSIS — J449 Chronic obstructive pulmonary disease, unspecified: Secondary | ICD-10-CM | POA: Diagnosis not present

## 2019-12-15 DIAGNOSIS — K529 Noninfective gastroenteritis and colitis, unspecified: Secondary | ICD-10-CM | POA: Diagnosis not present

## 2019-12-15 DIAGNOSIS — Z809 Family history of malignant neoplasm, unspecified: Secondary | ICD-10-CM | POA: Diagnosis not present

## 2019-12-15 DIAGNOSIS — Z801 Family history of malignant neoplasm of trachea, bronchus and lung: Secondary | ICD-10-CM | POA: Diagnosis not present

## 2019-12-15 DIAGNOSIS — Z820 Family history of epilepsy and other diseases of the nervous system: Secondary | ICD-10-CM | POA: Diagnosis not present

## 2019-12-15 DIAGNOSIS — Z87891 Personal history of nicotine dependence: Secondary | ICD-10-CM | POA: Diagnosis not present

## 2020-02-14 DIAGNOSIS — M542 Cervicalgia: Secondary | ICD-10-CM | POA: Diagnosis not present

## 2020-02-14 DIAGNOSIS — E340 Carcinoid syndrome: Secondary | ICD-10-CM | POA: Diagnosis not present

## 2020-02-14 DIAGNOSIS — J449 Chronic obstructive pulmonary disease, unspecified: Secondary | ICD-10-CM | POA: Diagnosis not present

## 2020-05-25 DIAGNOSIS — H25813 Combined forms of age-related cataract, bilateral: Secondary | ICD-10-CM | POA: Diagnosis not present

## 2020-05-25 DIAGNOSIS — H5200 Hypermetropia, unspecified eye: Secondary | ICD-10-CM | POA: Diagnosis not present

## 2020-05-25 DIAGNOSIS — H02831 Dermatochalasis of right upper eyelid: Secondary | ICD-10-CM | POA: Diagnosis not present

## 2020-05-25 DIAGNOSIS — H04123 Dry eye syndrome of bilateral lacrimal glands: Secondary | ICD-10-CM | POA: Diagnosis not present

## 2020-05-25 DIAGNOSIS — H02834 Dermatochalasis of left upper eyelid: Secondary | ICD-10-CM | POA: Diagnosis not present

## 2020-05-26 DIAGNOSIS — Z0100 Encounter for examination of eyes and vision without abnormal findings: Secondary | ICD-10-CM | POA: Diagnosis not present

## 2020-08-03 DIAGNOSIS — R21 Rash and other nonspecific skin eruption: Secondary | ICD-10-CM | POA: Diagnosis not present

## 2020-08-03 DIAGNOSIS — L299 Pruritus, unspecified: Secondary | ICD-10-CM | POA: Diagnosis not present

## 2020-08-15 DIAGNOSIS — R51 Headache with orthostatic component, not elsewhere classified: Secondary | ICD-10-CM | POA: Diagnosis not present

## 2020-08-15 DIAGNOSIS — J449 Chronic obstructive pulmonary disease, unspecified: Secondary | ICD-10-CM | POA: Diagnosis not present

## 2020-08-15 DIAGNOSIS — Z0000 Encounter for general adult medical examination without abnormal findings: Secondary | ICD-10-CM | POA: Diagnosis not present

## 2020-08-15 DIAGNOSIS — Z125 Encounter for screening for malignant neoplasm of prostate: Secondary | ICD-10-CM | POA: Diagnosis not present

## 2020-08-15 DIAGNOSIS — E785 Hyperlipidemia, unspecified: Secondary | ICD-10-CM | POA: Diagnosis not present

## 2020-08-15 DIAGNOSIS — C7A019 Malignant carcinoid tumor of the small intestine, unspecified portion: Secondary | ICD-10-CM | POA: Diagnosis not present

## 2020-08-15 DIAGNOSIS — Z7189 Other specified counseling: Secondary | ICD-10-CM | POA: Diagnosis not present

## 2020-11-06 DIAGNOSIS — Z23 Encounter for immunization: Secondary | ICD-10-CM | POA: Diagnosis not present

## 2020-11-06 DIAGNOSIS — C7A019 Malignant carcinoid tumor of the small intestine, unspecified portion: Secondary | ICD-10-CM | POA: Diagnosis not present

## 2020-11-06 DIAGNOSIS — J449 Chronic obstructive pulmonary disease, unspecified: Secondary | ICD-10-CM | POA: Diagnosis not present

## 2020-11-06 DIAGNOSIS — C7A010 Malignant carcinoid tumor of the duodenum: Secondary | ICD-10-CM | POA: Diagnosis not present

## 2020-11-09 ENCOUNTER — Other Ambulatory Visit: Payer: Self-pay | Admitting: Family Medicine

## 2020-11-09 DIAGNOSIS — C7A019 Malignant carcinoid tumor of the small intestine, unspecified portion: Secondary | ICD-10-CM

## 2020-11-13 DIAGNOSIS — N200 Calculus of kidney: Secondary | ICD-10-CM | POA: Diagnosis not present

## 2020-11-13 DIAGNOSIS — R634 Abnormal weight loss: Secondary | ICD-10-CM | POA: Diagnosis not present

## 2020-11-13 DIAGNOSIS — C7A019 Malignant carcinoid tumor of the small intestine, unspecified portion: Secondary | ICD-10-CM | POA: Diagnosis not present

## 2020-11-13 DIAGNOSIS — C7901 Secondary malignant neoplasm of right kidney and renal pelvis: Secondary | ICD-10-CM | POA: Diagnosis not present

## 2020-11-15 ENCOUNTER — Other Ambulatory Visit: Payer: Medicare HMO

## 2020-11-30 ENCOUNTER — Other Ambulatory Visit: Payer: Self-pay | Admitting: Student

## 2020-11-30 ENCOUNTER — Other Ambulatory Visit: Payer: Medicare HMO

## 2020-12-04 DIAGNOSIS — R634 Abnormal weight loss: Secondary | ICD-10-CM | POA: Diagnosis not present

## 2020-12-04 DIAGNOSIS — C7A010 Malignant carcinoid tumor of the duodenum: Secondary | ICD-10-CM | POA: Diagnosis not present

## 2020-12-07 ENCOUNTER — Ambulatory Visit
Admission: RE | Admit: 2020-12-07 | Discharge: 2020-12-07 | Disposition: A | Discharge status: Home / Self Care | Payer: Medicare HMO | Source: Ambulatory Visit | Attending: Family Medicine | Admitting: Family Medicine

## 2020-12-07 ENCOUNTER — Other Ambulatory Visit: Payer: Self-pay

## 2020-12-07 DIAGNOSIS — C7A019 Malignant carcinoid tumor of the small intestine, unspecified portion: Secondary | ICD-10-CM

## 2020-12-07 DIAGNOSIS — I700 Atherosclerosis of aorta: Secondary | ICD-10-CM | POA: Diagnosis not present

## 2020-12-07 DIAGNOSIS — N200 Calculus of kidney: Secondary | ICD-10-CM | POA: Diagnosis not present

## 2021-01-20 DIAGNOSIS — Z008 Encounter for other general examination: Secondary | ICD-10-CM | POA: Diagnosis not present

## 2021-01-20 DIAGNOSIS — Z809 Family history of malignant neoplasm, unspecified: Secondary | ICD-10-CM | POA: Diagnosis not present

## 2021-01-20 DIAGNOSIS — R197 Diarrhea, unspecified: Secondary | ICD-10-CM | POA: Diagnosis not present

## 2021-01-20 DIAGNOSIS — Z87891 Personal history of nicotine dependence: Secondary | ICD-10-CM | POA: Diagnosis not present

## 2021-01-20 DIAGNOSIS — R030 Elevated blood-pressure reading, without diagnosis of hypertension: Secondary | ICD-10-CM | POA: Diagnosis not present

## 2021-01-20 DIAGNOSIS — Z7722 Contact with and (suspected) exposure to environmental tobacco smoke (acute) (chronic): Secondary | ICD-10-CM | POA: Diagnosis not present

## 2021-01-20 DIAGNOSIS — Z8509 Personal history of malignant neoplasm of other digestive organs: Secondary | ICD-10-CM | POA: Diagnosis not present

## 2021-08-20 DIAGNOSIS — J449 Chronic obstructive pulmonary disease, unspecified: Secondary | ICD-10-CM | POA: Diagnosis not present

## 2021-08-20 DIAGNOSIS — J209 Acute bronchitis, unspecified: Secondary | ICD-10-CM | POA: Diagnosis not present

## 2021-09-04 DIAGNOSIS — Z0100 Encounter for examination of eyes and vision without abnormal findings: Secondary | ICD-10-CM | POA: Diagnosis not present

## 2021-09-04 DIAGNOSIS — H25813 Combined forms of age-related cataract, bilateral: Secondary | ICD-10-CM | POA: Diagnosis not present

## 2021-09-04 DIAGNOSIS — H5200 Hypermetropia, unspecified eye: Secondary | ICD-10-CM | POA: Diagnosis not present

## 2022-01-30 DIAGNOSIS — Z833 Family history of diabetes mellitus: Secondary | ICD-10-CM | POA: Diagnosis not present

## 2022-01-30 DIAGNOSIS — Z85038 Personal history of other malignant neoplasm of large intestine: Secondary | ICD-10-CM | POA: Diagnosis not present

## 2022-01-30 DIAGNOSIS — G8929 Other chronic pain: Secondary | ICD-10-CM | POA: Diagnosis not present

## 2022-01-30 DIAGNOSIS — Z87891 Personal history of nicotine dependence: Secondary | ICD-10-CM | POA: Diagnosis not present

## 2022-01-30 DIAGNOSIS — Z823 Family history of stroke: Secondary | ICD-10-CM | POA: Diagnosis not present

## 2022-01-30 DIAGNOSIS — Z008 Encounter for other general examination: Secondary | ICD-10-CM | POA: Diagnosis not present

## 2022-03-12 DIAGNOSIS — R69 Illness, unspecified: Secondary | ICD-10-CM | POA: Diagnosis not present

## 2022-03-12 DIAGNOSIS — J449 Chronic obstructive pulmonary disease, unspecified: Secondary | ICD-10-CM | POA: Diagnosis not present

## 2022-03-12 DIAGNOSIS — F5221 Male erectile disorder: Secondary | ICD-10-CM | POA: Diagnosis not present

## 2022-03-12 DIAGNOSIS — R197 Diarrhea, unspecified: Secondary | ICD-10-CM | POA: Diagnosis not present

## 2022-03-12 DIAGNOSIS — R519 Headache, unspecified: Secondary | ICD-10-CM | POA: Diagnosis not present

## 2022-03-12 DIAGNOSIS — C7401 Malignant neoplasm of cortex of right adrenal gland: Secondary | ICD-10-CM | POA: Diagnosis not present

## 2022-03-13 ENCOUNTER — Other Ambulatory Visit: Payer: Self-pay | Admitting: Family Medicine

## 2022-03-13 ENCOUNTER — Other Ambulatory Visit (HOSPITAL_COMMUNITY): Payer: Self-pay | Admitting: Family Medicine

## 2022-03-13 DIAGNOSIS — R519 Headache, unspecified: Secondary | ICD-10-CM

## 2022-08-27 DIAGNOSIS — R69 Illness, unspecified: Secondary | ICD-10-CM | POA: Diagnosis not present

## 2022-08-27 DIAGNOSIS — Z23 Encounter for immunization: Secondary | ICD-10-CM | POA: Diagnosis not present

## 2022-08-27 DIAGNOSIS — C7A019 Malignant carcinoid tumor of the small intestine, unspecified portion: Secondary | ICD-10-CM | POA: Diagnosis not present

## 2022-08-27 DIAGNOSIS — R197 Diarrhea, unspecified: Secondary | ICD-10-CM | POA: Diagnosis not present

## 2022-08-27 DIAGNOSIS — C7801 Secondary malignant neoplasm of right lung: Secondary | ICD-10-CM | POA: Diagnosis not present

## 2022-08-27 DIAGNOSIS — J449 Chronic obstructive pulmonary disease, unspecified: Secondary | ICD-10-CM | POA: Diagnosis not present

## 2022-08-27 DIAGNOSIS — F5221 Male erectile disorder: Secondary | ICD-10-CM | POA: Diagnosis not present

## 2022-09-12 ENCOUNTER — Other Ambulatory Visit (HOSPITAL_COMMUNITY): Payer: Self-pay | Admitting: Family Medicine

## 2022-09-12 ENCOUNTER — Other Ambulatory Visit: Payer: Self-pay | Admitting: Family Medicine

## 2022-09-12 DIAGNOSIS — Z8719 Personal history of other diseases of the digestive system: Secondary | ICD-10-CM

## 2022-09-12 DIAGNOSIS — R109 Unspecified abdominal pain: Secondary | ICD-10-CM

## 2022-09-19 ENCOUNTER — Ambulatory Visit (HOSPITAL_COMMUNITY)
Admission: RE | Admit: 2022-09-19 | Discharge: 2022-09-19 | Disposition: A | Discharge status: Home / Self Care | Payer: Medicare HMO | Source: Ambulatory Visit | Attending: Family Medicine | Admitting: Family Medicine

## 2022-09-19 DIAGNOSIS — Z8719 Personal history of other diseases of the digestive system: Secondary | ICD-10-CM | POA: Insufficient documentation

## 2022-09-19 DIAGNOSIS — R109 Unspecified abdominal pain: Secondary | ICD-10-CM | POA: Diagnosis not present

## 2022-09-24 DIAGNOSIS — R197 Diarrhea, unspecified: Secondary | ICD-10-CM | POA: Diagnosis not present

## 2022-09-24 DIAGNOSIS — R69 Illness, unspecified: Secondary | ICD-10-CM | POA: Diagnosis not present

## 2022-09-24 DIAGNOSIS — C7A019 Malignant carcinoid tumor of the small intestine, unspecified portion: Secondary | ICD-10-CM | POA: Diagnosis not present

## 2022-09-24 DIAGNOSIS — J449 Chronic obstructive pulmonary disease, unspecified: Secondary | ICD-10-CM | POA: Diagnosis not present

## 2022-11-19 DIAGNOSIS — M25552 Pain in left hip: Secondary | ICD-10-CM | POA: Diagnosis not present

## 2022-11-27 DIAGNOSIS — H25813 Combined forms of age-related cataract, bilateral: Secondary | ICD-10-CM | POA: Diagnosis not present

## 2022-11-27 DIAGNOSIS — H52223 Regular astigmatism, bilateral: Secondary | ICD-10-CM | POA: Diagnosis not present

## 2022-11-27 DIAGNOSIS — H5203 Hypermetropia, bilateral: Secondary | ICD-10-CM | POA: Diagnosis not present

## 2022-11-27 DIAGNOSIS — H524 Presbyopia: Secondary | ICD-10-CM | POA: Diagnosis not present

## 2022-12-03 DIAGNOSIS — M25552 Pain in left hip: Secondary | ICD-10-CM | POA: Diagnosis not present

## 2022-12-05 DIAGNOSIS — Z85038 Personal history of other malignant neoplasm of large intestine: Secondary | ICD-10-CM | POA: Diagnosis not present

## 2022-12-05 DIAGNOSIS — L309 Dermatitis, unspecified: Secondary | ICD-10-CM | POA: Diagnosis not present

## 2022-12-05 DIAGNOSIS — Z7952 Long term (current) use of systemic steroids: Secondary | ICD-10-CM | POA: Diagnosis not present

## 2022-12-05 DIAGNOSIS — Z833 Family history of diabetes mellitus: Secondary | ICD-10-CM | POA: Diagnosis not present

## 2022-12-05 DIAGNOSIS — Z809 Family history of malignant neoplasm, unspecified: Secondary | ICD-10-CM | POA: Diagnosis not present

## 2022-12-05 DIAGNOSIS — M1990 Unspecified osteoarthritis, unspecified site: Secondary | ICD-10-CM | POA: Diagnosis not present

## 2022-12-05 DIAGNOSIS — Z87891 Personal history of nicotine dependence: Secondary | ICD-10-CM | POA: Diagnosis not present

## 2022-12-05 DIAGNOSIS — N529 Male erectile dysfunction, unspecified: Secondary | ICD-10-CM | POA: Diagnosis not present

## 2022-12-05 DIAGNOSIS — Z8249 Family history of ischemic heart disease and other diseases of the circulatory system: Secondary | ICD-10-CM | POA: Diagnosis not present

## 2023-03-04 DIAGNOSIS — E785 Hyperlipidemia, unspecified: Secondary | ICD-10-CM | POA: Diagnosis not present

## 2023-03-04 DIAGNOSIS — R42 Dizziness and giddiness: Secondary | ICD-10-CM | POA: Diagnosis not present

## 2023-03-04 DIAGNOSIS — J449 Chronic obstructive pulmonary disease, unspecified: Secondary | ICD-10-CM | POA: Diagnosis not present

## 2024-04-26 ENCOUNTER — Other Ambulatory Visit (HOSPITAL_COMMUNITY): Payer: Self-pay | Admitting: Family Medicine

## 2024-04-26 DIAGNOSIS — R42 Dizziness and giddiness: Secondary | ICD-10-CM

## 2024-04-26 DIAGNOSIS — H814 Vertigo of central origin: Secondary | ICD-10-CM

## 2024-04-26 DIAGNOSIS — J449 Chronic obstructive pulmonary disease, unspecified: Secondary | ICD-10-CM | POA: Diagnosis not present

## 2024-04-26 DIAGNOSIS — E7800 Pure hypercholesterolemia, unspecified: Secondary | ICD-10-CM | POA: Diagnosis not present

## 2024-04-26 DIAGNOSIS — Z6821 Body mass index (BMI) 21.0-21.9, adult: Secondary | ICD-10-CM | POA: Diagnosis not present

## 2024-05-06 ENCOUNTER — Ambulatory Visit (HOSPITAL_COMMUNITY)
Admission: RE | Admit: 2024-05-06 | Discharge: 2024-05-06 | Disposition: A | Discharge status: Home / Self Care | Source: Ambulatory Visit | Attending: Family Medicine | Admitting: Family Medicine

## 2024-05-06 DIAGNOSIS — H814 Vertigo of central origin: Secondary | ICD-10-CM | POA: Diagnosis not present

## 2024-05-06 DIAGNOSIS — R42 Dizziness and giddiness: Secondary | ICD-10-CM | POA: Insufficient documentation

## 2024-07-27 DIAGNOSIS — J449 Chronic obstructive pulmonary disease, unspecified: Secondary | ICD-10-CM | POA: Diagnosis not present

## 2024-07-27 DIAGNOSIS — E7800 Pure hypercholesterolemia, unspecified: Secondary | ICD-10-CM | POA: Diagnosis not present

## 2024-07-27 DIAGNOSIS — E3400 Carcinoid syndrome, unspecified: Secondary | ICD-10-CM | POA: Diagnosis not present

## 2024-07-27 DIAGNOSIS — R42 Dizziness and giddiness: Secondary | ICD-10-CM | POA: Diagnosis not present
